# Patient Record
Sex: Male | Born: 1945 | Race: White | Hispanic: No | Marital: Married
Health system: Midwestern US, Community
[De-identification: ages and names within clinical notes are randomized; demographics above are authoritative.]

## PROBLEM LIST (undated history)

## (undated) DIAGNOSIS — IMO0001 Reserved for inherently not codable concepts without codable children: Secondary | ICD-10-CM

## (undated) DIAGNOSIS — T8149XA Infection following a procedure, other surgical site, initial encounter: Secondary | ICD-10-CM

## (undated) DIAGNOSIS — R519 Headache, unspecified: Secondary | ICD-10-CM

## (undated) DIAGNOSIS — A498 Other bacterial infections of unspecified site: Secondary | ICD-10-CM

## (undated) DIAGNOSIS — R918 Other nonspecific abnormal finding of lung field: Secondary | ICD-10-CM

---

## 2017-01-07 ENCOUNTER — Emergency Department: Admit: 2017-01-07 | Payer: MEDICARE | Primary: Family

## 2017-01-07 ENCOUNTER — Inpatient Hospital Stay: Admit: 2017-01-07 | Discharge: 2017-01-07 | Disposition: A | Discharge status: Home / Self Care | Payer: MEDICARE

## 2017-01-07 DIAGNOSIS — M25461 Effusion, right knee: Secondary | ICD-10-CM

## 2017-01-07 MED ORDER — OXYCODONE-ACETAMINOPHEN 5-325 MG PO TABS
5-325 MG | Freq: Once | ORAL | Status: AC
Start: 2017-01-07 — End: 2017-01-07
  Administered 2017-01-07: 16:00:00 1 via ORAL

## 2017-01-07 MED ORDER — OXYCODONE-ACETAMINOPHEN 5-325 MG PO TABS
5-325 MG | ORAL_TABLET | Freq: Three times a day (TID) | ORAL | 0 refills | Status: AC | PRN
Start: 2017-01-07 — End: 2017-01-13

## 2017-01-07 MED ORDER — OXYCODONE-ACETAMINOPHEN 5-325 MG PO TABS
5-325 MG | ORAL | Status: AC
Start: 2017-01-07 — End: 2017-01-08

## 2017-01-07 NOTE — Discharge Instructions (Signed)
Take medications as directed.  Do not take this medication if you have to drive, work or operate machinery.  Follow up with the orthopedist listed above or the orthopedist of your choice.

## 2017-01-07 NOTE — ED Provider Notes (Signed)
[address] [facility] ED  eMERGENCY dEPARTMENT eNCOUnter      Pt Name: Nicholas Santiago  MRN: [id number]  Birthdate [birth year]  Date of evaluation: 01/07/2017  Provider: Pecola Lawless, APRN - CNP    CHIEF COMPLAINT       Chief Complaint   Patient presents with   . Knee Pain     right seen at [facility] yesterday         HISTORY OF PRESENT ILLNESS  (Location/Symptom, Timing/Onset, Context/Setting, Quality, Duration, Modifying Factors, Severity.)   Nicholas Santiago is a 71 y.o. male who presents to the emergency department private auto with the knee pain.  He has had chronic pain since a military injury in 1971.  Friday while at work he was walking up steps and fell backwards causing him to twist his knee.  He did not fall to the ground.  He has had pain since that time.  His knee feels unsteady when he ambulates.  He rested his knee throughout the weekend but went back to work Monday and the pain worsened to the point were he is now using a crutch.  He is having difficulty bearing weight.  He is diffuse right knee pain that he rates at an 8 when weightbearing.  He gets significant relief at rest.  Pain does not radiate.  No erythema, warmth, swelling or fevers.  He did get seen at the [facility] clinic in [facility] yesterday was told he is "too old for surgery".     Nursing Notes were reviewed.    ALLERGIES     Pcn [penicillins]    CURRENT MEDICATIONS       Previous Medications    ATENOLOL PO    Take by mouth daily    FUROSEMIDE (LASIX PO)    Take by mouth daily    GABAPENTIN (NEURONTIN PO)    Take by mouth 3 times daily    POTASSIUM PO    Take by mouth 2 times daily    TRAZODONE HCL PO    Take by mouth nightly       PAST MEDICAL HISTORY         Diagnosis Date   . Cerebral artery occlusion with cerebral infarction (HCC)    . COPD (chronic obstructive pulmonary disease) (HCC)    . Hyperlipidemia    . Hypertension    . PTSD (post-traumatic stress disorder)        SURGICAL HISTORY           Procedure Laterality Date   . CORONARY ARTERY  BYPASS GRAFT     . KNEE SURGERY Right          FAMILY HISTORY     History reviewed. No pertinent family history.  No family status information on file.        SOCIAL HISTORY      reports that he has been smoking Cigarettes.  He has been smoking about 1.00 pack per day. He has never used smokeless tobacco. He reports that he drinks alcohol. He reports that he does not use drugs.    REVIEW OF SYSTEMS    (2-9 systems for level 4, 10 or more for level 5)     Review of Systems   All other systems reviewed and are negative.       Except as noted above the remainder of the review of systems was reviewed and negative.     PHYSICAL EXAM    (up to 7 for level  4, 8 or more for level 5)     ED Triage Vitals [01/07/17 1138]   BP Temp Temp Source Pulse Resp SpO2 Height Weight   (!) 143/51 97.5 F (36.4 C) Oral 71 18 97 %  (1.778 m) 270 lb 6.4 oz (122.7 kg)       Physical Exam   Constitutional: He is oriented to person, place, and time. He appears well-developed and well-nourished.   Eyes: Conjunctivae are normal. Right eye exhibits no discharge. Left eye exhibits no discharge.   Cardiovascular: Normal rate, regular rhythm and intact distal pulses.    Pulmonary/Chest: Effort normal and breath sounds normal. No respiratory distress.   Musculoskeletal:   Able to fully flex and extend the right hip and knee.  Full range of motion of the ankle.  Mild diffuse bony tenderness to the right knee. Joint is not hot to touch   Neurological: He is alert and oriented to person, place, and time.   Skin: Skin is warm and dry. No erythema.         DIAGNOSTIC RESULTS     EKG: All EKG's are interpreted by the Emergency Department Physician who either signs or Co-signs this chart in the absence of a cardiologist.    RADIOLOGY:   Non-plain film images such as CT, Ultrasound and MRI are read by the radiologist. 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             LABS:  Labs Reviewed - No data to display    All other labs were within normal range or not returned as of this dictation.    EMERGENCY DEPARTMENT COURSE and DIFFERENTIAL DIAGNOSIS/MDM:   Vitals:    Vitals:    01/07/17 1138   BP: (!) 143/51   Pulse: 71   Resp: 18   Temp: 97.5 F (36.4 C)   TempSrc: Oral   SpO2: 97%   Weight: 270 lb 6.4 oz (122.7 kg)   Height:  (1.778 m)       Medical Decision Making: 71 year old male with exacerbation of chronic knee pain.  Imaging shows degenerative changes and small effusion.  He was placed in a knee immobilizer.  I did check the placement of the immobilizer and found it to be appropriate.  He remains neurovascularly intact.  He was discharged with pain medication and instructions to follow-up with the orthopedist.        FINAL IMPRESSION      1. Acute pain of right knee    2. Knee effusion, right          DISPOSITION/PLAN   DISPOSITION Decision To Discharge 01/07/2017 12:41:28 PM      PATIENT REFERRED TO:   Liston Alba, DO            DISCHARGE MEDICATIONS:     New Prescriptions    OXYCODONE-ACETAMINOPHEN (PERCOCET) 5-325 MG PER TABLET    Take 1 tablet by mouth every 8 hours as needed for Pain for up to 6 days..       (Please note that portions of this note were completed with a voice recognition program.  Efforts were made to edit  the dictations but occasionally words are mis-transcribed.)    Ido Wollman A Mylani Gentry, APRN - CNP             Julaine FusiEdna A Jodi Kappes, APRN - CNP  01/07/17 1248

## 2017-01-07 NOTE — ED Notes (Signed)
Pt presents to ED ambulatory with steady gait c/o of chronic right knee pain since 1971. Pt states 5 days ago twisting it and the pain increasing. Pt rates pain 10/10. No deformity or bruising noted. Swelling noted to right knee. Pt has limited ROM due to pain. Skin is pink, warm, and dry.      Korea Severs Tenna Delaine, RN  01/07/17 [phone]

## 2018-12-24 ENCOUNTER — Ambulatory Visit: Admit: 2018-12-24 | Discharge: 2018-12-24 | Attending: Family Medicine | Primary: Family Medicine

## 2018-12-24 DIAGNOSIS — R0789 Other chest pain: Secondary | ICD-10-CM

## 2018-12-24 NOTE — Progress Notes (Signed)
Subjective:      Patient ID: Nicholas Santiago is a 73 y.o. male.    Hearts good thinks it was his PTSD      Review of Systems   Constitutional: Negative.        Objective:   Physical Exam  Vitals signs reviewed.   HENT:      Head: Normocephalic.   Cardiovascular:      Rate and Rhythm: Normal rate.   Pulmonary:      Effort: Pulmonary effort is normal.   Neurological:      Mental Status: He is alert.         Assessment:      1. Atypical chest pain    2. PTSD (post-traumatic stress disorder)            Plan:      Si was seen today for follow-up from hospital and headache.    Diagnoses and all orders for this visit:    Atypical chest pain    PTSD (post-traumatic stress disorder)                Electronically signed by Ulice Dash Won Kreuzer, MD on 12/24/2018 at 11:24 AM

## 2019-07-20 ENCOUNTER — Ambulatory Visit: Admit: 2019-07-20 | Discharge: 2019-07-20 | Payer: MEDICARE | Attending: Family Medicine | Primary: Family Medicine

## 2019-07-20 DIAGNOSIS — Z01818 Encounter for other preprocedural examination: Secondary | ICD-10-CM

## 2019-07-20 MED ORDER — CLONIDINE HCL 0.1 MG PO TABS
0.1 MG | ORAL_TABLET | Freq: Two times a day (BID) | ORAL | 3 refills | Status: DC
Start: 2019-07-20 — End: 2019-10-19

## 2019-07-20 MED ORDER — GABAPENTIN 300 MG PO CAPS
300 MG | ORAL_CAPSULE | Freq: Two times a day (BID) | ORAL | 3 refills | Status: DC
Start: 2019-07-20 — End: 2020-01-04

## 2019-07-20 NOTE — Progress Notes (Signed)
Subjective:      Patient ID: Nicholas Santiago is a 74 y.o. male.    Doing well  Still smokes and wants to quit  Dr Nunzio Cory to do TKR in future  Concerned re leg swelling and BP and fatigue that [facility] been [facility] with  Here for preop clearance      Review of Systems   Constitutional: Positive for activity change and fatigue.   HENT: Negative.    Eyes: Negative.    Respiratory: Positive for shortness of breath.    Cardiovascular: Positive for leg swelling.   Gastrointestinal: Positive for abdominal distention.   Endocrine: Negative.    Genitourinary: Negative.    Musculoskeletal: Positive for arthralgias and joint swelling.   Neurological: Negative.    Hematological: Negative.    Psychiatric/Behavioral: Negative.        Objective:   Physical Exam  Vitals signs reviewed.   Constitutional:       Appearance: He is obese.   HENT:      Head: Normocephalic and atraumatic.      Right Ear: Tympanic membrane normal.      Left Ear: Tympanic membrane normal.      Nose: Nose normal.   Eyes:      Pupils: Pupils are equal, round, and reactive to light.   Neck:      Musculoskeletal: Normal range of motion.   Cardiovascular:      Rate and Rhythm: Normal rate and regular rhythm.   Pulmonary:      Effort: Pulmonary effort is normal.      Breath sounds: Normal breath sounds.   Abdominal:      General: Bowel sounds are normal.   Musculoskeletal:         General: Swelling present.      Right lower leg: Edema present.   Skin:     General: Skin is warm.   Neurological:      General: No focal deficit present.      Mental Status: He is alert.   Psychiatric:         Mood and Affect: Mood normal.         Behavior: Behavior normal.         Thought Content: Thought content normal.         Judgment: Judgment normal.         Assessment:      No diagnosis found.        Plan:      There are no diagnoses linked to this encounter.  D/c atenolol  Stay on coreg  Trial of clonidine for occassional BP elevations  Says had a colonoscopy awile  back  Lots of care thru [place] with limited records          Electronically signed by Ulice Dash Coco Sharpnack, MD on 07/20/2019 at 12:09 PM

## 2019-08-30 ENCOUNTER — Ambulatory Visit: Admit: 2019-08-30 | Discharge: 2019-08-30 | Payer: MEDICARE | Attending: Family Medicine | Primary: Family Medicine

## 2019-08-30 DIAGNOSIS — J449 Chronic obstructive pulmonary disease, unspecified: Secondary | ICD-10-CM

## 2019-08-30 NOTE — Progress Notes (Signed)
Subjective:      Patient ID: Nicholas Santiago is a 74 y.o. male.    Multiple medical issues  But needs a new knee  [facility]      Review of Systems   Constitutional: Positive for activity change.   HENT: Positive for dental problem.    Respiratory: Positive for shortness of breath.    Cardiovascular: Positive for leg swelling.   Gastrointestinal: Negative.    Endocrine: Negative.    Genitourinary: Negative.    Musculoskeletal: Positive for gait problem.   Skin: Negative.    Neurological:        Foot pain   Psychiatric/Behavioral: Negative.        Objective:   Physical Exam  Vitals reviewed.   Constitutional:       Appearance: Normal appearance.   HENT:      Head: Normocephalic and atraumatic.      Right Ear: Ear canal normal.      Left Ear: Ear canal normal.      Nose: Nose normal.      Mouth/Throat:      Mouth: Mucous membranes are dry.   Eyes:      Pupils: Pupils are equal, round, and reactive to light.   Cardiovascular:      Rate and Rhythm: Normal rate and regular rhythm.   Pulmonary:      Effort: Pulmonary effort is normal.   Abdominal:      General: Bowel sounds are normal.   Musculoskeletal:         General: Deformity present.      Cervical back: Normal range of motion.   Neurological:      General: No focal deficit present.      Mental Status: He is alert.   Psychiatric:         Mood and Affect: Mood normal.         Thought Content: Thought content normal.         Assessment:      No diagnosis found.        Plan:      There are no diagnoses linked to this encounter.            Electronically signed by Ulice Dash Sagal Gayton, MD on 08/30/2019 at 10:03 AM

## 2019-09-15 NOTE — Telephone Encounter (Signed)
pt declined AWV at this time.

## 2019-09-28 NOTE — Telephone Encounter (Signed)
I have recommended that this patient have a colonoscopy but he declines at this time. I have discussed the risks and benefits of this examination with him. The patient verbalizes understanding.

## 2019-10-03 ENCOUNTER — Ambulatory Visit: Admit: 2019-10-03 | Discharge: 2019-10-03 | Payer: MEDICARE | Attending: Family Medicine | Primary: Family Medicine

## 2019-10-03 DIAGNOSIS — R519 Headache, unspecified: Secondary | ICD-10-CM

## 2019-10-03 MED ORDER — BUTALBITAL-APAP-CAFF-COD 50-325-40-30 MG PO CAPS
50-325-40-30 MG | ORAL_CAPSULE | ORAL | 0 refills | Status: AC | PRN
Start: 2019-10-03 — End: 2019-11-02

## 2019-10-03 NOTE — Telephone Encounter (Signed)
Reason for Disposition  ??? Unexplained headache that is present > 24 hours    Additional Information  ??? Negative: SEVERE headache and not relieved by pain meds     Has taken excedrin and tylenol    Answer Assessment - Initial Assessment Questions  1. LOCATION: "Where does it hurt?"     Above L ear area    2. ONSET: "When did the headache start?" (Minutes, hours or days)       X 3 wks    3. PATTERN: "Does the pain come and go, or has it been constant since it started?"      Comes and goes    4. SEVERITY: "How bad is the pain?" and "What does it keep you from doing?"  (e.g., Scale 1-10; mild, moderate, or severe)    - MILD (1-3): doesn't interfere with normal activities     - MODERATE (4-7): interferes with normal activities or awakens from sleep     - SEVERE (8-10): excruciating pain, unable to do any normal activities         5 per pt    5. RECURRENT SYMPTOM: "Have you ever had headaches before?" If so, ask: "When was the last time?" and "What happened that time?"       denies    6. CAUSE: "What do you think is causing the headache?"      Unsure- states there are scabs in area and is painful to touch, states pain began after starting abx    7. MIGRAINE: "Have you been diagnosed with migraine headaches?" If so, ask: "Is this headache similar?"   denies        8. HEAD INJURY: "Has there been any recent injury to the head?"       Denies    9. OTHER SYMPTOMS: "Do you have any other symptoms?" (fever, stiff neck, eye pain, sore throat, cold symptoms)     Dizziness/lightheadedness started this weekend    10. PREGNANCY: "Is there any chance you are pregnant?" "When was your last menstrual period?"       n/a    Protocols used: HEADACHE-ADULT-OH    Received call from Amy at [facility] with [employer] Complaint.    Brief description of triage: Pt called with concern of moderate intermittent L temporal headache X 3 wks. Notes scabbing over area and has some occasional dizziness/lightheadedness. Hx of recent p/o surgical  site infection/abx therapy.     Triage indicates for patient to PCP today or tomorrow.    Care advice provided, patient verbalizes understanding; denies any other questions or concerns; instructed to call back for any new or worsening symptoms.    Writer provided warm transfer to Maralyn Sago at [facility] for appointment scheduling.    Attention Provider:  Thank you for allowing me to participate in the care of your patient.  The patient was connected to triage in response to information provided to the [facility].  Please do not respond through this encounter as the response is not directed to a shared pool.

## 2019-10-03 NOTE — Progress Notes (Signed)
Subjective:      Patient ID: Nicholas Santiago is a 74 y.o. male.    Right sided headache since knee surgery  Says he can feel bumps   No fever  Has had shingles vax      Review of Systems   HENT: Negative.    Eyes: Negative.    Respiratory: Negative.    Gastrointestinal: Negative.    Genitourinary: Negative.    Musculoskeletal: Negative.    Neurological: Positive for headaches.   Psychiatric/Behavioral: Negative.    All other systems reviewed and are negative.      Objective:   Physical Exam  HENT:      Head: Normocephalic and atraumatic.      Right Ear: Tympanic membrane normal.      Left Ear: Tympanic membrane normal.      Nose: Nose normal.   Eyes:      Pupils: Pupils are equal, round, and reactive to light.   Cardiovascular:      Rate and Rhythm: Normal rate.   Pulmonary:      Effort: Pulmonary effort is normal.   Musculoskeletal:         General: Normal range of motion.   Skin:     General: Skin is warm.   Neurological:      General: No focal deficit present.      Mental Status: He is alert.   Psychiatric:         Thought Content: Thought content normal.         Assessment:      1. Acute nonintractable headache, unspecified headache type            Plan:      [name] was seen today for headache.    Diagnoses and all orders for this visit:    Acute nonintractable headache, unspecified headache type  -     butalbital-acetaminophen-caffeine-codeine (FIORICET WITH CODEINE) 50-325-40-30 MG per capsule; Take 1 capsule by mouth every 4 hours as needed for Headaches for up to 10 doses.                Electronically signed by Ulice Dash Finn Amos, MD on 10/03/2019 at 12:00 PM

## 2019-10-19 ENCOUNTER — Ambulatory Visit: Admit: 2019-10-19 | Discharge: 2019-10-19 | Payer: MEDICARE | Attending: Family Medicine | Primary: Family Medicine

## 2019-10-19 DIAGNOSIS — R519 Headache, unspecified: Secondary | ICD-10-CM

## 2019-10-19 MED ORDER — PREDNISONE 20 MG PO TABS
20 MG | ORAL_TABLET | Freq: Two times a day (BID) | ORAL | 0 refills | Status: AC
Start: 2019-10-19 — End: 2019-10-24

## 2019-10-21 NOTE — Progress Notes (Signed)
Subjective:      Patient ID: Nicholas Santiago is a 74 y.o. male.    fiorecet did nothing   Pain is up and down right face    Headache         Review of Systems   Constitutional: Negative.    Neurological: Positive for headaches.   All other systems reviewed and are negative.      Objective:   Physical Exam  Constitutional:       Appearance: Normal appearance.   Neurological:      Mental Status: He is alert.      Comments: No lesions  Slight assymetry of right eye region         Assessment:      1. Right temporal headache            Plan:      Nicholas Santiago was seen today for headache.    Diagnoses and all orders for this visit:    Right temporal headache    Other orders  -     predniSONE (DELTASONE) 20 MG tablet; Take 1 tablet by mouth 2 times daily for 5 days    does not look like shingles or Bells  Could this be temporal arteritis?            Electronically signed by Ulice Dash Parry Po, MD on 10/19/2019 at 1:16 PM

## 2019-10-31 ENCOUNTER — Encounter

## 2019-10-31 NOTE — Telephone Encounter (Signed)
-----   Message from Blake Divine sent at 10/31/2019  8:34 AM EDT -----  Subject: Referral Request    QUESTIONS   Reason for referral request? patient needs a referral to a neurologist.   having pains in the right side of his head behind his ear. Moving behind   his eye, and slowly moving to the left side. fax number [phone]   Has the physician seen you for this condition before? No   Preferred Specialist (if applicable)?   Do you already have an appointment scheduled? No  Additional Information for Provider?   ---------------------------------------------------------------------------  --------------  CALL BACK INFO  What is the best way for the office to contact you? OK to leave message on   voicemail  Preferred Call Back Phone Number? [phone]

## 2019-11-01 ENCOUNTER — Ambulatory Visit: Admit: 2019-11-01 | Discharge: 2019-11-01 | Payer: MEDICARE | Attending: Family Medicine | Primary: Family Medicine

## 2019-11-01 ENCOUNTER — Telehealth

## 2019-11-01 DIAGNOSIS — R519 Headache, unspecified: Secondary | ICD-10-CM

## 2019-11-01 NOTE — Telephone Encounter (Signed)
[facility] called and stated they need pre-cert for the imaging ordered ,faxed over last 2 office notes as that is all that could be found

## 2019-11-01 NOTE — Progress Notes (Signed)
Subjective:      Patient ID: Nicholas Santiago is a 74 y.o. male.    1 month right side of head down to neck with some visual disturbance some relief with meds but not lasting  No resp or sinus issues  Is really knocking him down    Other  Associated symptoms include headaches.       Review of Systems   Constitutional: Negative.    HENT: Negative.    Eyes: Positive for visual disturbance.   Respiratory: Negative.    Cardiovascular: Negative.    Gastrointestinal: Negative.    Musculoskeletal: Negative.    Neurological: Positive for headaches.   Psychiatric/Behavioral: Negative.    All other systems reviewed and are negative.      Objective:   Physical Exam  HENT:      Head: Normocephalic and atraumatic.      Nose: Nose normal.   Eyes:      Pupils: Pupils are equal, round, and reactive to light.   Musculoskeletal:         General: Normal range of motion.      Cervical back: Rigidity present.   Neurological:      Mental Status: He is alert.      Comments: CN 2 - 12 intact   Psychiatric:         Mood and Affect: Mood normal.         Thought Content: Thought content normal.         Judgment: Judgment normal.         Assessment:      1. Acute intractable headache, unspecified headache type    2. Right temporal headache            Plan:      Genie was seen today for other.    Diagnoses and all orders for this visit:    Acute intractable headache, unspecified headache type  -     CT CERVICAL SPINE WO CONTRAST; Future  -     CT HEAD WO CONTRAST; Future    Right temporal headache                Electronically signed by Ulice Dash Nyx Keady, MD on 11/01/2019 at 10:29 AM

## 2019-11-03 NOTE — Telephone Encounter (Signed)
-----   Message from Darden Amber sent at 11/03/2019  9:25 AM EDT -----  Subject: Message to Provider    QUESTIONS  Information for Provider? Pt needs provider to fax over both CT scans to   the [place] in [employer]. The fax number is [phone]. Please call pt once   this has been faxed over.   ---------------------------------------------------------------------------  --------------  Cleotis Lema INFO  What is the best way for the office to contact you? OK to leave message on   voicemail  Preferred Call Back Phone Number? [phone]  ---------------------------------------------------------------------------  --------------  SCRIPT ANSWERS  Relationship to Patient? Other  Representative Name? Servando Kyllonen   Is the Representative on the appropriate HIPAA document in Epic? Yes

## 2019-11-04 NOTE — Telephone Encounter (Signed)
Contacted pre cert department due to them needing a peer review. The physician I spoke to said no to the spine CT but said yes to the brain CT. He suggested to do the brain for any abnormalities before moving to the spine.

## 2019-11-07 NOTE — Addendum Note (Signed)
Addended by: Lynnette Caffey on: 11/07/2019 08:12 AM     Modules accepted: Orders

## 2019-11-08 NOTE — Telephone Encounter (Signed)
error 

## 2019-11-10 NOTE — Telephone Encounter (Signed)
-----   Message from Dallas Schimke sent at 11/10/2019  1:12 PM EDT -----  Subject: Message to Provider    QUESTIONS  Information for Provider? Patient would like to a call to discuss results   of CT Scan when they arrive.   ---------------------------------------------------------------------------  --------------  CALL BACK INFO  What is the best way for the office to contact you? OK to leave message on   voicemail  Preferred Call Back Phone Number? [phone]  ---------------------------------------------------------------------------  --------------  SCRIPT ANSWERS  Relationship to Patient? Self

## 2019-11-10 NOTE — Telephone Encounter (Signed)
-----   Message from Deborah Bishop sent at 11/10/2019  1:12 PM EDT -----  Subject: Message to Provider    QUESTIONS  Information for Provider? Patient would like to a call to discuss results   of CT Scan when they arrive.   ---------------------------------------------------------------------------  --------------  CALL BACK INFO  What is the best way for the office to contact you? OK to leave message on   voicemail  Preferred Call Back Phone Number? [phone]  ---------------------------------------------------------------------------  --------------  SCRIPT ANSWERS  Relationship to Patient? Self

## 2019-11-10 NOTE — Telephone Encounter (Signed)
-----   Message from Jeri Cos sent at 11/10/2019  3:36 PM EDT -----  Subject: Results Request    QUESTIONS  Which lab or imaging result is the patient calling about? CT Scan  Which provider ordered the test? Suzi Roots   At what location was the test performed?   Date the test was performed? 2019-11-08  Additional Information for Provider? Patient is still suffering from his   headaches   ---------------------------------------------------------------------------  --------------  CALL BACK INFO  What is the best way for the office to contact you? OK to leave message on   voicemail  Preferred Call Back Phone Number? [phone]

## 2019-11-11 NOTE — Telephone Encounter (Signed)
Called [facility] to get the results for the ct scan that Adrean had done, they said they will fax over the results. Dr. Lucretia Field MA will call the pt with the results when the doctor signs off on it.

## 2019-11-11 NOTE — Telephone Encounter (Signed)
-----   Message from Drue Novel sent at 11/11/2019 10:33 AM EDT -----  Subject: Results Request    QUESTIONS  Which lab or imaging result is the patient calling about? CT scan  Which provider ordered the test? Suzi Roots   At what location was the test performed?   Date the test was performed? 2019-11-08  Additional Information for Provider? test done at [facility] ,   please call pt with results   ---------------------------------------------------------------------------  --------------  CALL BACK INFO  What is the best way for the office to contact you? OK to leave message on   voicemail  Preferred Call Back Phone Number? [phone]

## 2019-11-11 NOTE — Telephone Encounter (Signed)
Results came in after Dood had left for the day. Will call Monday after Dood reviews them. He expressed he is having so much pain it is making him physically ill. I instructed him to go to the ER if he is in that much pain.

## 2019-11-14 ENCOUNTER — Ambulatory Visit: Admit: 2019-11-14 | Discharge: 2019-11-14 | Payer: MEDICARE | Attending: Family Medicine | Primary: Family Medicine

## 2019-11-14 DIAGNOSIS — R300 Dysuria: Secondary | ICD-10-CM

## 2019-11-14 LAB — POCT URINALYSIS DIPSTICK
Bilirubin, UA: NEGATIVE
Blood, UA POC: NEGATIVE
Glucose, UA POC: NEGATIVE
Ketones, UA: NEGATIVE
Leukocytes, UA: NEGATIVE
Nitrite, UA: NEGATIVE
Protein, UA POC: NEGATIVE
Spec Grav, UA: 1.03
Urobilinogen, UA: 0.2
pH, UA: 6

## 2019-11-14 MED ORDER — CEPHALEXIN 500 MG PO CAPS
500 MG | ORAL_CAPSULE | Freq: Three times a day (TID) | ORAL | 0 refills | Status: AC
Start: 2019-11-14 — End: 2019-11-21

## 2019-11-14 NOTE — Progress Notes (Signed)
Subjective:     Patient ID: Nicholas Santiago is a 74 y.o. male    HPI  This patient presents for a same-day appointment walk-in.  He is a patient of Dr. Adine Madura who presents with burning painful urination and the passage of whitish mucoid material in his urine.  He denies any flank pain or history of kidney stones.  He denies any history of sexually transmitted diseases.  Secondly the patient complains of right temporal headache and pain in the face.  He asked for the report of his CT scan of the brain which was grossly normal.  I was instructed by Dr. Malachi Carl not to address this concern as he was handling it and had referred him to a neurologist.      Review of Systems  Denies chest pain palpitations shortness of breath abdominal pain blood in the urine diarrhea or constipation        Objective:     Physical Exam  Vitals and nursing note reviewed.   Constitutional:       General: He is not in acute distress.     Appearance: Normal appearance.   HENT:      Head: Normocephalic.      Right Ear: External ear normal.      Left Ear: External ear normal.      Nose: Nose normal.      Mouth/Throat:      Mouth: Mucous membranes are moist.   Eyes:      Conjunctiva/sclera: Conjunctivae normal.   Cardiovascular:      Rate and Rhythm: Normal rate and regular rhythm.      Heart sounds: Normal heart sounds.   Pulmonary:      Effort: Pulmonary effort is normal.      Breath sounds: Normal breath sounds.   Abdominal:      Tenderness: There is abdominal tenderness. There is no right CVA tenderness, left CVA tenderness, guarding or rebound.      Comments: Does have some minimal tenderness in the right mid abdomen but there is no rebound guarding or mass detected   Musculoskeletal:      Cervical back: Normal range of motion.      Right lower leg: No edema.      Left lower leg: No edema.   Skin:     General: Skin is warm and dry.   Neurological:      General: No focal deficit present.      Mental Status: He is alert and oriented to  person, place, and time.   Psychiatric:         Mood and Affect: Mood normal.         Behavior: Behavior normal.           Assessment/Plan:     1. Dysuria    2. Right lower quadrant abdominal pain            [name] was seen today for dysuria.    Diagnoses and all orders for this visit:    Dysuria  -     POCT Urinalysis no Micro  We will treat with antibiotics empirically because of his history  Right lower quadrant abdominal pain  If pain gets any worse please contact the office or had to the emergency room   other orders  -     cephALEXin (KEFLEX) 500 MG capsule; Take 1 capsule by mouth 3 times daily for 7 days      Start Cephalexin 500 mg tid  1 week    Avoid caffeine  Alcohol    Please follow-up with Dr. Lucretia Field in regard to his recommendations for the headache.  I have not addressed these    Call Dr Malachi Carl if no better        Marlou Starks, MD    Please note that this chart was generated using voice recognition Dragon dictation software.  Although every effort was made to ensure the accuracy of this automated transcription, some errors in transcription may have occurred.

## 2019-11-14 NOTE — Telephone Encounter (Signed)
[facility] called in regards to a call back from clinical staff [phone]

## 2019-11-15 ENCOUNTER — Encounter

## 2019-12-07 ENCOUNTER — Ambulatory Visit: Admit: 2019-12-07 | Discharge: 2019-12-07 | Payer: MEDICARE | Attending: Family | Primary: Family Medicine

## 2019-12-07 DIAGNOSIS — G44201 Tension-type headache, unspecified, intractable: Secondary | ICD-10-CM

## 2019-12-07 MED ORDER — BUTALBITAL-APAP-CAFF-COD 50-325-40-30 MG PO CAPS
50-325-40-30 MG | ORAL_CAPSULE | Freq: Four times a day (QID) | ORAL | 0 refills | Status: AC | PRN
Start: 2019-12-07 — End: 2019-12-17

## 2019-12-07 NOTE — Progress Notes (Signed)
[facility]  [facility] [place] [facility]  [address]  [place] [address]  Dept: [phone]       Nicholas Santiago is a 74 y.o. male who presents to the office today for evaluation of Headache and Dizziness    Pt here with concerns of headache. He had a CT scan which was normal, has an appt with neuro on 01/19/20. He sees cardiology. His BP has been running low and is low today, dicussed decreasing carvedilol to once daily instead of BID. Dr. Malachi Carl also came in and assessed patient. Fioricet helped in the past, will give script, reduce carvedilol, and keep f/u appt with neuro. If symptoms not improving or worsening, call office. Pt understands and agreeable to plan.     Patient Active Problem List    Diagnosis Date Noted   ??? Essential hypertension 07/20/2019     Patient's BMI is Body mass index is 35.1 kg/m??.   Social History     Tobacco Use   ??? Smoking status: Current Every Day Smoker     Packs/day: 1.00     Years: 51.00     Pack years: 51.00     Types: Cigarettes     Start date: 3   ??? Smokeless tobacco: Never Used   Vaping Use   ??? Vaping Use: Never used   Substance Use Topics   ??? Alcohol use: Yes     Comment: occasionally   ??? Drug use: No      Past Medical History:   Diagnosis Date   ??? 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 COPD (chronic obstructive pulmonary disease) (HCC)    ??? Headache    ??? Hyperlipidemia    ??? Hypertension    ??? PTSD (post-traumatic stress disorder)       No data recorded   Immunization:  Immunization History   Administered Date(s) Administered   ??? Influenza, High-dose, Quadv, 65 yrs +, IM (Fluzone) 03/21/2019     Past Surgical History:   Procedure Laterality Date   ??? CORONARY ARTERY BYPASS GRAFT     ??? JOINT REPLACEMENT     ??? KNEE SURGERY Right    History reviewed. No pertinent family history.  Current Outpatient Medications   Medication Sig Dispense Refill   ??? butalbital-acetaminophen-caffeine-codeine (FIORICET WITH CODEINE) 50-325-40-30 MG  per capsule Take 1 capsule by mouth every 6 hours as needed for Headaches for up to 10 days. 20 capsule 0   ??? atorvastatin (LIPITOR) 80 MG tablet Take 80 mg by mouth     ??? gabapentin (NEURONTIN) 300 MG capsule Take 1 capsule by mouth 2 times daily for 30 days. 60 capsule 3   ??? aspirin 81 MG EC tablet Take 81 mg by mouth daily     ??? carvedilol (COREG) 12.5 MG tablet Take 12.5 mg by mouth 2 times daily     ??? potassium chloride (MICRO-K) 10 MEQ extended release capsule Take by mouth 2 times daily     ??? furosemide (LASIX) 80 MG tablet Take 80 mg by mouth daily       No current facility-administered medications for this visit.     The patient's past medical, surgical, social, and family history as well as his current medications and allergies were reviewed as documented in today's encounter.    Review of Systems   Constitutional: Negative.    HENT: Negative.    Eyes: Negative.    Respiratory: Negative.    Cardiovascular:  Negative.    Gastrointestinal: Negative.    Endocrine: Negative.    Genitourinary: Negative.    Musculoskeletal: Negative.    Skin: Negative.    Allergic/Immunologic: Negative.    Neurological: Positive for dizziness, light-headedness and headaches.   Hematological: Negative.    Psychiatric/Behavioral: Negative.    All other systems reviewed and are negative.     BP (!) 102/58    Pulse 78    Ht 5\' 10"  (1.778 m)    Wt 244 lb 9.6 oz (110.9 kg)    SpO2 98%    BMI 35.10 kg/m??   Physical Exam  Vitals and nursing note reviewed.   Constitutional:       Appearance: Normal appearance. He is normal weight.   HENT:      Right Ear: Tympanic membrane, ear canal and external ear normal.      Left Ear: Tympanic membrane, ear canal and external ear normal.      Nose: Nose normal.      Mouth/Throat:      Mouth: Mucous membranes are moist.      Pharynx: Oropharynx is clear.   Eyes:      Extraocular Movements: Extraocular movements intact.      Conjunctiva/sclera: Conjunctivae normal.      Pupils: Pupils are equal, round,  and reactive to light.   Cardiovascular:      Rate and Rhythm: Normal rate and regular rhythm.      Pulses: Normal pulses.      Heart sounds: Normal heart sounds.   Pulmonary:      Effort: Pulmonary effort is normal.      Breath sounds: Normal breath sounds.   Abdominal:      General: Abdomen is flat. Bowel sounds are normal.      Palpations: Abdomen is soft.   Musculoskeletal:      Cervical back: Normal range of motion.   Skin:     General: Skin is warm and dry.      Capillary Refill: Capillary refill takes less than 2 seconds.   Neurological:      General: No focal deficit present.      Mental Status: He is alert.       No results found for: WBC, HGB, HCT, MCV, PLT  No results found for: NA, K, CL, CO2, BUN, CREATININE, GLUCOSE, CALCIUM   No results found for: ALT, AST, GGT, ALKPHOS, BILITOT  No results found for: TSHFT4, TSH  No results found for: CHOL  No results found for: TRIG  No results found for: HDL  No results found for: LDLCALC, LDLCHOLESTEROL  No results found for: CHOLHDLRATIO  No results found for: LABA1C  No results found for: VITAMINB12  No results found for: FOLATE  No results found for: IRON, TIBC, FERRITIN  No results found for: VITD25  I personally reviewed testing with patient.  Otherwise labs within normal limits  ASSESSMENT AND PLAN  1. Acute intractable tension-type headache    - butalbital-acetaminophen-caffeine-codeine (FIORICET WITH CODEINE) 50-325-40-30 MG per capsule; Take 1 capsule by mouth every 6 hours as needed for Headaches for up to 10 days.  Dispense: 20 capsule; Refill: 0    2. Hypotension due to drugs       No orders of the defined types were placed in this encounter.    Orders Placed This Encounter   Medications   ??? butalbital-acetaminophen-caffeine-codeine (FIORICET WITH CODEINE) 50-325-40-30 MG per capsule     Sig: Take 1 capsule by mouth every 6  hours as needed for Headaches for up to 10 days.     Dispense:  20 capsule     Refill:  0     Reduce doses taken as pain becomes  manageable      Data Unavailable  Health Maintenance Due   Topic Date Due   ??? Potassium monitoring  Never done   ??? Creatinine monitoring  Never done   ??? AAA screen  Never done   ??? Hepatitis C screen  Never done   ??? Lipid screen  Never done   ??? COVID-19 Vaccine (1) Never done   ??? DTaP/Tdap/Td vaccine (1 - Tdap) Never done   ??? Colon cancer screen colonoscopy  Never done   ??? Shingles Vaccine (1 of 2) Never done   ??? Pneumococcal 65+ years Vaccine (1 of 1 - PPSV23) Never done   ??? Annual Wellness Visit (AWV)  Never done   ??? Flu vaccine (1) 12/07/2019      There are no discontinued medications.  The patient's past medical, surgical, social, and family history as well as his   current medications and allergies were reviewed as documented in today's encounter.    Medications, labs, diagnostic studies, consultations and follow-up as documented in this encounter.  Return in about 4 weeks (around 01/04/2020).  Future Appointments   Date Time Provider Department Center   01/19/2020 10:40 AM Merry Lofty, MD Neuro Spec [id number]     This note was completed by using the assistance of a speech-recognition program. However, inadvertent computerized transcription errors may be present. Although every effort was made to ensure accuracy, no guarantees can be provided that every mistake has been identified and corrected by editing.  Electronically signed by [place] Crane, APRN - CNP on 12/07/2019 at 12:23 PM

## 2019-12-07 NOTE — Telephone Encounter (Signed)
Received call from [facility] at [facility] with [employer].    Brief description of triage:no triage seen 11/15/19 for same problem.       Care advice provided, patient verbalizes understanding; denies any other questions or concerns; instructed to call back for any new or worsening symptoms.    Writer provided warm transfer to [employer] at [facility] for appointment scheduling.    Attention Provider:  Thank you for allowing me to participate in the care of your patient.  The patient was connected to triage in response to information provided to the [facility].  Please do not respond through this encounter as the response is not directed to a shared pool.        Reason for Disposition  ??? Caller has already spoken with the PCP (or office), and has no further questions    Protocols used: NO CONTACT OR DUPLICATE CONTACT CALL-ADULT-[place]

## 2019-12-13 NOTE — Telephone Encounter (Signed)
Received call from [place] at [facility] with [employer] Complaint.    Brief description of triage: HA and dizziness    Triage indicates for patient to *no triage*. Patient has already seen PCP for this and denies any new or worsening symptoms. He states that he is just still having the same symptoms and wishes to have another appointment.         Reason for Disposition  ??? Caller has already spoken with the PCP (or office), and has no further questions    Protocols used: NO CONTACT OR DUPLICATE CONTACT CALL-ADULT-[place]

## 2019-12-14 ENCOUNTER — Inpatient Hospital Stay: Payer: MEDICARE | Primary: Family Medicine

## 2019-12-14 ENCOUNTER — Ambulatory Visit: Admit: 2019-12-14 | Discharge: 2019-12-14 | Payer: MEDICARE | Attending: Family Medicine | Primary: Family Medicine

## 2019-12-14 DIAGNOSIS — I10 Essential (primary) hypertension: Secondary | ICD-10-CM

## 2019-12-14 DIAGNOSIS — R739 Hyperglycemia, unspecified: Secondary | ICD-10-CM

## 2019-12-14 MED ORDER — POTASSIUM CHLORIDE ER 10 MEQ PO CPCR
10 MEQ | ORAL_CAPSULE | Freq: Every day | ORAL | 1 refills | Status: DC
Start: 2019-12-14 — End: 2020-01-04

## 2019-12-14 MED ORDER — CARVEDILOL 12.5 MG PO TABS
12.5 MG | ORAL_TABLET | Freq: Two times a day (BID) | ORAL | 1 refills | Status: DC
Start: 2019-12-14 — End: 2020-09-12

## 2019-12-14 MED ORDER — FUROSEMIDE 80 MG PO TABS
80 MG | ORAL_TABLET | Freq: Every day | ORAL | 1 refills | Status: DC
Start: 2019-12-14 — End: 2020-11-27

## 2019-12-15 LAB — HEPATIC FUNCTION PANEL
ALT: 19 U/L (ref 5–41)
AST: 17 U/L (ref <40)
Albumin/Globulin Ratio: 1.4 (ref 1.0–2.5)
Albumin: 4.1 g/dL (ref 3.5–5.2)
Alkaline Phosphatase: 132 U/L — ABNORMAL HIGH (ref 40–129)
Bilirubin, Direct: 0.1 mg/dL (ref <0.31)
Bilirubin, Indirect: 0.18 mg/dL (ref 0.00–1.00)
Total Bilirubin: 0.28 mg/dL — ABNORMAL LOW (ref 0.3–1.2)
Total Protein: 7 g/dL (ref 6.4–8.3)

## 2019-12-15 LAB — BUN & CREATININE
BUN: 12 mg/dL (ref 8–23)
Creatinine: 0.92 mg/dL (ref 0.70–1.20)
GFR African American: >60 mL/min (ref >60)
GFR Non-African American: >60 mL/min (ref >60)

## 2019-12-15 LAB — ELECTROLYTE PANEL
Anion Gap: 13 mmol/L (ref 9–17)
CO2: 25 mmol/L (ref 20–31)
Chloride: 103 mmol/L (ref 98–107)
Potassium: 4.1 mmol/L (ref 3.7–5.3)
Sodium: 141 mmol/L (ref 135–144)

## 2019-12-15 LAB — HEMOGLOBIN A1C
Estimated Avg Glucose: 143 mg/dL
Hemoglobin A1C: 6.6 % — ABNORMAL HIGH (ref 4.0–6.0)

## 2019-12-16 ENCOUNTER — Ambulatory Visit: Admit: 2019-12-16 | Discharge: 2019-12-16 | Payer: MEDICARE | Attending: Family Medicine | Primary: Family Medicine

## 2019-12-16 DIAGNOSIS — R739 Hyperglycemia, unspecified: Secondary | ICD-10-CM

## 2019-12-16 MED ORDER — METFORMIN HCL ER 500 MG PO TB24
500 MG | ORAL_TABLET | Freq: Every day | ORAL | 0 refills | Status: DC
Start: 2019-12-16 — End: 2020-01-04

## 2019-12-16 NOTE — Progress Notes (Signed)
Subjective:      Patient ID: Nicholas Santiago is a 74 y.o. male.    Asymptomatic with A1C of 6.6  Really likes carbs      Review of Systems   Constitutional: Negative.    HENT: Negative.    Eyes: Negative.    Respiratory: Negative.    Cardiovascular: Negative.    Gastrointestinal: Negative.    Endocrine: Negative.    Genitourinary: Negative.    Musculoskeletal: Negative.    Allergic/Immunologic: Negative.    Neurological: Negative.    Hematological: Negative.    Psychiatric/Behavioral: Negative.    All other systems reviewed and are negative.      Objective:   Physical Exam  Vitals reviewed.   Constitutional:       Appearance: Normal appearance.   Cardiovascular:      Rate and Rhythm: Normal rate.   Pulmonary:      Effort: Pulmonary effort is normal.   Skin:     General: Skin is warm.   Neurological:      Mental Status: He is alert.   Psychiatric:         Mood and Affect: Mood normal.         Thought Content: Thought content normal.         Assessment:      1. Hyperglycemia            Plan:      Nicholas Santiago was seen today for discuss labs and headache.    Diagnoses and all orders for this visit:    Hyperglycemia    Other orders  -     metFORMIN (GLUCOPHAGE-XR) 500 MG extended release tablet; Take 1 tablet by mouth daily (with breakfast)    discussed diet and exercise  He will consider medication  Waiting for neurology appointment and then will go to [place] for 6 or more months  Rt after he comes back for recheck  He also has a [place] [place]  Go to his cardiologist about the wide swings in BP          Electronically signed by Ulice Dash Helyne Genther, MD on 12/16/2019 at 11:10 AM

## 2019-12-16 NOTE — Progress Notes (Signed)
Subjective:      Patient ID: Nicholas Santiago is a 74 y.o. male.    Having wide swings in BP  Still has HA   Asks for labs      Review of Systems   Constitutional: Negative.    HENT: Negative.    Eyes: Negative.    Respiratory: Negative.    Cardiovascular: Negative.    Gastrointestinal: Negative.    Endocrine: Negative.    Genitourinary: Negative.    Musculoskeletal: Negative.    Neurological: Positive for headaches.   Hematological: Negative.    Psychiatric/Behavioral: Negative.    All other systems reviewed and are negative.      Objective:   Physical Exam  Vitals reviewed.   Constitutional:       Appearance: Normal appearance.   HENT:      Head: Normocephalic and atraumatic.      Nose: Nose normal.   Eyes:      Pupils: Pupils are equal, round, and reactive to light.   Cardiovascular:      Rate and Rhythm: Normal rate.   Pulmonary:      Effort: Pulmonary effort is normal.      Comments: coarse  Abdominal:      General: Bowel sounds are normal.   Musculoskeletal:         General: Normal range of motion.      Cervical back: Normal range of motion.   Skin:     General: Skin is warm.   Neurological:      Mental Status: He is alert.   Psychiatric:         Mood and Affect: Mood normal.         Thought Content: Thought content normal.         Assessment:      1. Hypertension, unspecified type    2. Hyperglycemia            Plan:      Savino was seen today for headache and other.    Diagnoses and all orders for this visit:    Hypertension, unspecified type  -     Hepatic Function Panel; Future  -     BUN & Creatinine; Future  -     Electrolyte Panel; Future  -     Cancel: Hemoglobin A1C; Future    Hyperglycemia  -     Hemoglobin A1C; Future    Other orders  -     carvedilol (COREG) 12.5 MG tablet; Take 0.5 tablets by mouth 2 times daily  -     furosemide (LASIX) 80 MG tablet; Take 0.5 tablets by mouth daily  -     potassium chloride (MICRO-K) 10 MEQ extended release capsule; Take 1 capsule by mouth daily    we  decreased his BB today and his lasix            Electronically signed by Ulice Dash Merrik Puebla, MD on 12/14/2019 at 11:26 AM

## 2020-01-04 ENCOUNTER — Ambulatory Visit: Admit: 2020-01-04 | Discharge: 2020-01-04 | Payer: MEDICARE | Attending: Family Medicine | Primary: Family Medicine

## 2020-01-04 DIAGNOSIS — R42 Dizziness and giddiness: Secondary | ICD-10-CM

## 2020-01-04 MED ORDER — MECLIZINE HCL 25 MG PO TABS
25 MG | ORAL_TABLET | Freq: Three times a day (TID) | ORAL | 0 refills | Status: AC | PRN
Start: 2020-01-04 — End: 2020-01-14

## 2020-01-04 MED ORDER — GABAPENTIN 300 MG PO CAPS
300 MG | ORAL_CAPSULE | Freq: Two times a day (BID) | ORAL | 3 refills | Status: DC
Start: 2020-01-04 — End: 2020-01-26

## 2020-01-04 MED ORDER — CLONIDINE HCL 0.1 MG PO TABS
0.1 MG | ORAL_TABLET | Freq: Two times a day (BID) | ORAL | 3 refills | Status: DC
Start: 2020-01-04 — End: 2020-01-26

## 2020-01-04 NOTE — Progress Notes (Signed)
Subjective:      Patient ID: Nicholas Santiago is a 74 y.o. male.    Weird symptoms since his covid vax      Review of Systems   Constitutional: Positive for activity change.   Psychiatric/Behavioral: Negative.        Objective:   Physical Exam  Vitals reviewed.   Constitutional:       Appearance: Normal appearance.   HENT:      Head: Normocephalic and atraumatic.      Mouth/Throat:      Mouth: Mucous membranes are moist.   Eyes:      Pupils: Pupils are equal, round, and reactive to light.   Cardiovascular:      Rate and Rhythm: Normal rate.   Musculoskeletal:         General: Normal range of motion.   Skin:     General: Skin is warm and dry.   Neurological:      Mental Status: He is alert.   Psychiatric:         Mood and Affect: Mood normal.         Thought Content: Thought content normal.         Assessment:      1. Dizzy spells            Plan:      [name] was seen today for other.    Diagnoses and all orders for this visit:    Dizzy spells    Other orders  -     gabapentin (NEURONTIN) 300 MG capsule; Take 1 capsule by mouth 2 times daily for 30 days.  -     cloNIDine (CATAPRES) 0.1 MG tablet; Take 1 tablet by mouth 2 times daily  -     meclizine (ANTIVERT) 25 MG tablet; Take 1 tablet by mouth 3 times daily as needed for Dizziness                Electronically signed by Nicholas Dash Deisy Ozbun, MD on 01/04/2020 at 3:03 PM

## 2020-01-19 ENCOUNTER — Ambulatory Visit: Admit: 2020-01-19 | Discharge: 2020-01-19 | Payer: MEDICARE | Attending: Neurology | Primary: Family Medicine

## 2020-01-19 DIAGNOSIS — R519 Headache, unspecified: Secondary | ICD-10-CM

## 2020-01-19 NOTE — Progress Notes (Signed)
[facility]  [address], [address]  [place], [place] [id number]  Ph: [phone] or [phone]  FAX: [phone]    Chief Complaint: headache     Dear Ulice DashSTEVEN BRADFORD DOOD, MD     I had the pleasure of seeing your patient today in neurology consultation for his symptoms. As you would recall Nicholas Santiago is a 74 y.o. male.  The patient is accompanied by his wife. Wife states patient complains of headaches and dizziness. Wife reports headaches began approximately 3 months ago. Patient reports headaches begin in the back of his neck and typically worsen upon ambulating. Headaches tend to radiate to the crown and frontal aspects of his head. Head is painful to touch, headaches worsen upon touch. Patient notes headaches can last up to 1 week. Wife states at times episodes occur a couple of times weekly. Patient reports headaches and dizziness occur simultaneously; usually onset of dizziness is gradual and occurs first and gives the sensation he is spinning. To alleviate symptoms, patient states he lays down in bed or sits. Patient denies passing out from headaches/dizziness. Patient admits to sound sensitivity. Admits to double vision and cloudy vision. Patient reports Meclizine 25 mg TID PRN is helping with dizziness.     Patient's mother had brain tumor.     Past Medical History:   Diagnosis Date   ??? Cerebral artery occlusion with cerebral infarction [facility](HCC)    ??? COPD (chronic obstructive pulmonary disease) (HCC)    ??? Headache    ??? Hyperlipidemia    ??? Hypertension    ??? PTSD (post-traumatic stress disorder)      Past Surgical History:   Procedure Laterality Date   ??? CORONARY ARTERY BYPASS GRAFT     ??? JOINT REPLACEMENT     ??? KNEE SURGERY Right      Allergies   Allergen Reactions   ??? Pcn [Penicillins] Swelling and Rash     No family history on file.   Social History     Socioeconomic History   ??? Marital status: Married     Spouse name: Not on file   ??? Number of children: Not on file    ??? Years of education: Not on file   ??? Highest education level: Not on file   Occupational History   ??? Not on file   Tobacco Use   ??? Smoking status: Current Every Day Smoker     Packs/day: 1.00     Years: 51.00     Pack years: 51.00     Types: Cigarettes     Start date: 721970   ??? Smokeless tobacco: Never Used   Vaping Use   ??? Vaping Use: Never used   Substance and Sexual Activity   ??? Alcohol use: Yes     Comment: occasionally   ??? Drug use: No   ??? Sexual activity: Not on file   Other Topics Concern   ??? Not on file   Social History Narrative   ??? Not on file     Social Determinants of Health     Financial Resource Strain: Low Risk    ??? Difficulty of Paying Living Expenses: Not hard at all   Food Insecurity: No Food Insecurity   ??? Worried About Programme researcher, broadcasting/film/videounning Out of Food in the Last Year: Never true   ??? Ran Out of Food in the Last Year: Never true   Transportation Needs:    ??? Lack of Transportation (Medical):    ??? Lack  of Transportation (Non-Medical):    Physical Activity:    ??? Days of Exercise per Week:    ??? Minutes of Exercise per Session:    Stress:    ??? Feeling of Stress :    Social Connections:    ??? Frequency of Communication with Friends and Family:    ??? Frequency of Social Gatherings with Friends and Family:    ??? Attends Religious Services:    ??? Database administrator or Organizations:    ??? Attends Engineer, structural:    ??? Marital Status:    Intimate Programme researcher, broadcasting/film/video Violence:    ??? Fear of Current or Ex-Partner:    ??? Emotionally Abused:    ??? Physically Abused:    ??? Sexually Abused:       There were no vitals taken for this visit.      HEENT []  Hearing Loss  []  Visual Disturbance  []  Tinnitus  []  Eye pain   Respiratory []  Shortness of Breath  []  Cough  []  Snoring   Cardiovascular []  Chest Pain  []  Palpitations  []  Lightheaded   GI []  Constipation  []  Diarrhea  []  Swallowing change  []  Nausea/vomiting   GU []  Urinary Frequency  []  Urinary Urgency   Musculoskeletal []  Neck pain  []  Back pain  []  Muscle pain  []  Restless  legs   Dermatologic []  Skin changes   Neurologic []  Memory loss/confusion  []  Seizures  []  Trouble walking or imbalance  [x]  Dizziness  []  Sleep disturbance  []  Weakness  []  Numbness  []  Tremors  []  Speech Difficulty  [x]  Headaches  []  Light Sensitivity  [x]  Sound Sensitivity   Endocrinology [] Excessive thirst  [] Excessive hunger   Psychiatric []  Anxiety/Depression  []  Hallucination   Allergy/immunology [] Hives/environmental allergies   Hematologic/lymph []  Abnormal bleeding  []  Abnormal bruising     General appearence: Normal. Well groomed. In no acute distress    Head: Normocephalic, atraumatic  Eyes: Extraocular movements intact, eye lids normal  Lungs: Respirations unlabored, chest wall no deformity  ENT: Normal external ear canals, no sinus tenderness  Heart: Regular rate rhythm  Abdomen: No masses, tenderness  Extremities: No cyanosis or edema, 2+ pulses  Musculoskeletal: Normal range of motion in all joints  Skin: Intact, normal skin color    Neurological examination:    Mental status   Alert and oriented; intact memory with no confusion, speech or language problems; no hallucinations or delusions     Cranial nerves   II - visual fields intact to confrontation                                                III, IV, VI - extra-ocular muscles full: no pupillary defect; no INO, no nystagmus, no ptosis   V - normal facial sensation                                                               VII - normal facial symmetry  VIII - intact hearing                                                                             IX, X - symmetrical palate                                                                  XI - symmetrical shoulder shrug                                                       XII - midline tongue without atrophy or fasciculation     Motor function  Normal muscle bulk and tone; normal power 5/5, including fine motor movements     Sensory  function Intact to touch, pin, vibration, proprioception     Cerebellar Intact fine motor movement. No involuntary movements or tremors     Reflex function Intact 2+ DTR and symmetric. Negative Babinski     Gait                  Normal station and gait       No results found for: LDLCALC, LDLCHOLESTEROL, LDLDIRECT  No components found for: CHLPL  No results found for: TRIG  No results found for: HDL  No results found for: LDLCALC  No results found for: LABVLDL  Lab Results   Component Value Date    LABA1C 6.6 (H) 12/14/2019     Lab Results   Component Value Date    EAG 143 12/14/2019     No results found for: QQIWLNLG92     Neurological work up:  CT head  CTA head and neck  MRI brain   2 D echo     All of patient's labs were personally reviewed. All the imaging studies were personally reviewed and discussed with the patient.     Assessment Recommendations:  Migraine headache with associated dizziness  Possible orthostatic hypotension    Patient admits to headache frequency. MRA Head W Contrast and MRI Brain WO Contrast ordered today to further investigation patient's symptoms. If testing is completed in [place], I informed patient to call me after to let me know how testing went.      Among other things, clonidine can worsen orthostatic hypotension. I will speak with PCP to inquire more information about tapering patient off of medication. I also discussed with patient using a Holter monitor to monitor if irregular heart beat in patient is cause of dizziness. Patient would not like to proceed with Holter monitor at this time.     I ordered a sedimentation rate test to detect inflammatory etiologies. Patient is to complete at his earliest convenience.     The patient is to follow up with me in 6-8 weeks or sooner if symptoms worsen.    STEVEN BRADFORD  DOOD, MD I would like to thank you for the consult. Please do not hesitate if you have any questions about the patient care.     Scribe Attestation:   By signing my  name below, I, RAMYA TALLA, attest that this documentation has been prepared under the direction and in the presence of Merry Lofty, MD.      Electronically Signed: Marcello Fennel. 01/19/2020 10:39 AM      Physician Attestation:   Carlyon Shadow MD, personally performed the services described in this documentation. All medical record entries made by the scribe were at my direction and in my presence. I have reviewed the chart and discharge instructions (if applicable) and agree that the record reflects my personal performance and is accurate and complete.    Electronically Signed: Merry Lofty 01/19/2020 10:39 AM    Diplomate, American Board of Psychiatry and Neurology  Diplomate, American Board of Clinical Neurophysiology  Diplomate, American Board of Epilepsy     This note is created with the assistance of a speech-recognition program. While intending to generate a document that actually reflects the content of the visit, the document can still have some errors including those of syntax and sound a- like substitutions which may escape proofreading.  In such instances, actual meaning can be extrapolated by contextual derivation.

## 2020-01-26 MED ORDER — CLONIDINE HCL 0.1 MG PO TABS
0.1 MG | ORAL_TABLET | Freq: Two times a day (BID) | ORAL | 0 refills | Status: DC
Start: 2020-01-26 — End: 2020-11-27

## 2020-01-26 MED ORDER — GABAPENTIN 300 MG PO CAPS
300 MG | ORAL_CAPSULE | Freq: Two times a day (BID) | ORAL | 0 refills | Status: DC
Start: 2020-01-26 — End: 2020-11-13

## 2020-01-31 ENCOUNTER — Inpatient Hospital Stay: Admit: 2020-01-31 | Payer: MEDICARE | Primary: Family Medicine

## 2020-01-31 ENCOUNTER — Encounter

## 2020-01-31 DIAGNOSIS — R519 Headache, unspecified: Secondary | ICD-10-CM

## 2020-02-07 NOTE — Telephone Encounter (Signed)
Nicholas Santiago called in asking for results of his most recent MRI/MRA. He wants to know if any further testing is needed as well because they are waiting to go to [place]. Thanks

## 2020-02-07 NOTE — Telephone Encounter (Signed)
MRI scan of the brain is unremarkable.  At this time, I do not believe any further work-up is warranted and patient can go to [place].  We can discuss further once he comes back.  Thank you

## 2020-02-07 NOTE — Telephone Encounter (Signed)
Patient notified.

## 2020-02-22 ENCOUNTER — Encounter: Attending: Neurology | Primary: Family Medicine

## 2020-07-16 NOTE — Telephone Encounter (Signed)
Received call from Elease Hashimoto regarding patient-- Previous patient of Dr.Dood    Patient is currently in [place]-- Patient travels to [place] in winter time.     Elease Hashimoto sates patient had bowel obstruction and was "very sick" while in [place].     Patient had surgery 06/30/20. Patient has follow up appointment 07/25/20 with surgeon. Patient was told he cannot travel for 10-12 weeks and was wondering what should be done in the mean time? Since patient cannot come back for that long period of time.    Patient is aware Dr.Dood is retired -- advised patient wife I will send message back to Dr.Swartz to review     Patient will fax over all information from   [facility] [place]  Office fax number given    Patent attorney  Phone [phone]  Fax [phone]

## 2020-07-17 NOTE — Telephone Encounter (Signed)
Attempted to call the patient. voicemail full unable to leave a message

## 2020-07-17 NOTE — Telephone Encounter (Signed)
Thanks for trying

## 2020-09-12 ENCOUNTER — Ambulatory Visit: Admit: 2020-09-12 | Discharge: 2020-09-12 | Payer: MEDICARE | Attending: Family | Primary: Family

## 2020-09-12 DIAGNOSIS — Z87891 Personal history of nicotine dependence: Secondary | ICD-10-CM

## 2020-09-12 LAB — POCT GLYCOSYLATED HEMOGLOBIN (HGB A1C): Hemoglobin A1C: 4.9 %

## 2020-09-12 MED ORDER — METFORMIN HCL ER 500 MG PO TB24
500 MG | ORAL_TABLET | Freq: Every day | ORAL | 11 refills | Status: AC
Start: 2020-09-12 — End: 2022-06-10

## 2020-09-12 MED ORDER — IPRATROPIUM-ALBUTEROL 20-100 MCG/ACT IN AERS
20-100 MCG/ACT | Freq: Four times a day (QID) | RESPIRATORY_TRACT | 11 refills | Status: AC
Start: 2020-09-12 — End: 2021-09-12

## 2020-09-12 MED ORDER — HANDICAP PLACARD
0 refills | Status: DC
Start: 2020-09-12 — End: 2020-09-27

## 2020-09-12 MED ORDER — ASPIRIN 81 MG PO TBEC
81 MG | ORAL_TABLET | Freq: Every day | ORAL | 11 refills | Status: AC
Start: 2020-09-12 — End: 2021-09-12

## 2020-09-12 MED ORDER — CARVEDILOL 12.5 MG PO TABS
12.5 MG | ORAL_TABLET | Freq: Two times a day (BID) | ORAL | 11 refills | Status: DC
Start: 2020-09-12 — End: 2020-11-27

## 2020-09-12 MED ORDER — ATORVASTATIN CALCIUM 80 MG PO TABS
80 MG | ORAL_TABLET | Freq: Every day | ORAL | 11 refills | Status: AC
Start: 2020-09-12 — End: ?

## 2020-09-12 NOTE — Patient Instructions (Signed)
What is lung cancer screening?  Lung cancer screening is a way in which doctors check the lungs for early signs of cancer in people who have no symptoms of lung cancer.  A low-dose CT scan uses much less radiation than a normal CT scan and shows a more detailed image of the lungs than a standard X-ray.  The goal of lung cancer screening is to find cancer early, before it has a chance to grow, spread, or cause problems.  One large study found that smokers who were screened with low-dose CT scans were less likely to die of lung cancer than those who were screened with standard X-ray.    Below is a summary of the things you need to know regarding screening for lung cancer with low-dose computed tomography (LDCT).  This is a screening program that involves routine annual screening with LDCT studies of the lung.  The LDCTs are done using low-dose radiation that is not thought to increase your cancer risk.  If you have other serious medical conditions (other cancers, congestive heart failure) that limit your life expectancy to less than 10 years, you should not undergo lung cancer screening with LDCT.  The chance is 20%-60% that the LDCT result will show abnormalities.  This would require additional testing which could include repeat imaging or even invasive procedures.  Most (about 95%) of "abnormal" LDCT results are false in the sense that no lung cancer is ultimately found.  Additionally, some (about 10%) of the cancers found would not affect your life expectancy, even if undetected and untreated.  If you are still smoking, the single most important thing that you can do to reduce your risk of dying of lung cancer is to quit.    For this screening to be covered by Medicare and most other insurers, strict criteria must be met.  If you do not meet these criteria, but still wish to undergo LDCT testing, you will be required to sign a waiver indicating your willingness to pay for the scan.

## 2020-09-12 NOTE — Progress Notes (Signed)
[facility]   [address]  [place] [address]  [phone]    09/12/2020     CHIEF COMPLAINT:     Nicholas Santiago (DOB:  [birth year]) is a 75 y.o. male, here for evaluation of the following chief complaint(s):  Abdominal Pain (pt is having lower lower abdominal pain when he stands up from sitting, pt had bowel surgery on 3/26 in [place]) and Established New Doctor (transferred from [facility])    CPap with 0 2 at night.     REVIEWED INFORMATION      Allergies   Allergen Reactions   ??? Pcn [Penicillins] Swelling and Rash       Current Outpatient Medications   Medication Sig Dispense Refill   ??? metoprolol succinate (TOPROL XL) 50 MG extended release tablet Take 1 tablet by mouth in the morning and at bedtime     ??? traZODone (DESYREL) 50 MG tablet Take 100 mg by mouth nightly     ??? atorvastatin (LIPITOR) 80 MG tablet Take 1 tablet by mouth daily 30 tablet 11   ??? metFORMIN (GLUCOPHAGE-XR) 500 mg extended release tablet Take 1 tablet by mouth daily 30 tablet 11   ??? carvedilol (COREG) 12.5 MG tablet Take 0.5 tablets by mouth 2 times daily 60 tablet 11   ??? aspirin 81 MG EC tablet Take 1 tablet by mouth daily 30 tablet 11   ??? albuterol-ipratropium (COMBIVENT RESPIMAT) 20-100 MCG/ACT AERS inhaler Inhale 1 puff into the lungs every 6 hours 1 each 11   ??? gabapentin (NEURONTIN) 300 MG capsule Take 1 capsule by mouth 2 times daily for 90 days. 180 capsule 0   ??? prazosin (MINIPRESS) 1 MG capsule Take 1 mg by mouth nightly 3 capsules At night     ??? doxycycline hyclate (VIBRA-TABS) 100 MG tablet Take 1 tablet by mouth 2 times daily for 10 days 20 tablet 0   ??? predniSONE (DELTASONE) 20 MG tablet Take 2 tablets by mouth daily for 5 days 10 tablet 0   ??? cloNIDine (CATAPRES) 0.1 MG tablet Take 1 tablet by mouth 2 times daily (Patient not taking: Reported on 09/12/2020) 180 tablet 0   ??? meclizine (ANTIVERT) 25 MG tablet Take 25 mg by mouth 3 times daily as needed      ??? potassium chloride (MICRO-K) 10 MEQ  extended release capsule Take 10 mEq by mouth daily (Patient not taking: Reported on 09/12/2020)     ??? furosemide (LASIX) 80 MG tablet Take 0.5 tablets by mouth daily (Patient not taking: Reported on 09/12/2020) 30 tablet 1     No current facility-administered medications for this visit.        Patient Care Team:  Mayo Ao, APRN - CNP as PCP - General (Nurse Practitioner)  Mayo Ao, APRN - CNP as PCP - [facility] Empaneled Provider    REVIEW OF SYSTEMS:     Review of Systems   Constitutional: Negative for activity change, fatigue and unexpected weight change.   HENT: Negative for congestion, ear pain, hearing loss, rhinorrhea and sore throat.    Respiratory: Negative for cough and shortness of breath.    Cardiovascular: Negative for chest pain, palpitations and leg swelling.   Gastrointestinal: Negative for constipation and diarrhea. Abdominal distention: post surgical pain.   Musculoskeletal: Negative for arthralgias and gait problem.   Neurological: Negative for dizziness, weakness and headaches.   Psychiatric/Behavioral: Negative for confusion. The patient is not nervous/anxious.  HISTORY OF PRESENT ILLNESS     Presents today      Patient is a 75 year old male presents today after referral for new patient appointment.  He was recently a patient of Dr. Yetta Barre.  Patient travels back and forth between here in [place] between the winter and summer months.  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  Patient does primary care for most of the months down there and has been followed significantly with his providers.  However upon return here he is not with any providers and is continue to have abdominal pain intermittently in the left lower quadrant.  We discussed completing a CT to make sure there has been no change since the operation.  Which will be completed as well as maintaining his  regular health maintenance at this time.    PHYSICAL EXAM:     BP 132/72 (Site: Left Upper Arm, Position: Sitting, Cuff Size: Medium Adult)    Pulse 84    Temp 96.9 ??F (36.1 ??C) (Tympanic)    Ht 5' 9.5" (1.765 m)    Wt 217 lb (98.4 kg)    SpO2 94%    BMI 31.59 kg/m??      Physical Exam  Vitals reviewed.   Constitutional:       Appearance: Normal appearance. He is not ill-appearing.   HENT:      Head: Normocephalic and atraumatic.   Eyes:      Extraocular Movements: Extraocular movements intact.      Pupils: Pupils are equal, round, and reactive to light.   Neck:      Vascular: No carotid bruit.   Cardiovascular:      Rate and Rhythm: Normal rate and regular rhythm.      Pulses: Normal pulses.      Heart sounds: Normal heart sounds.   Pulmonary:      Effort: Pulmonary effort is normal.      Breath sounds: Normal breath sounds.   Abdominal:      General: Abdomen is protuberant. There is distension.      Palpations: Abdomen is soft. There is no mass.      Tenderness: There is generalized abdominal tenderness. There is no left CVA tenderness.       Musculoskeletal:         General: No swelling or tenderness. Normal range of motion.      Cervical back: Normal range of motion and neck supple.   Skin:     General: Skin is warm.      Capillary Refill: Capillary refill takes less than 2 seconds.      Findings: No erythema or rash.   Neurological:      General: No focal deficit present.      Mental Status: He is alert and oriented to person, place, and time.   Psychiatric:         Mood and Affect: Mood normal.         Behavior: Behavior normal.          PROCEDURE/ IN OFFICE TESTING/ LAB REVIEW     No in office testing or procedures completed during today's office visit.        ASSESSMENT/PLAN/ FOLLOWUP:     PLEASE NOTE THAT ANY DISCONTINUATION OF MEDICATIONS OR MEDICAL SUPPLIES REFLECTED IN TODAY'S VISIT SUMMARY  MAY NOT HAVE COMPLETED AS A CHANGE IN YOUR PLAN OF CARE. THESE  CHANGES MAY HAVE ONLY BEEN DONE SO IN ORDER TO CLEAN  UP LIST FROM DUPLICATIONS OR MISCELLANEOUS SUPPLIES ONLY NEEDED PERIODIC REORDERS. DO NOT DISCONTINUE MEDICATIONS LISTED UNLESS SPECIFICALLY DISCUSSED IN YOUR APPOINTMENT WITH PROVIDER OR SPECIALIST, IF YOU HAVE AN QUESTIONS, PLEASE CONTACT YOUR PROVIDER FOR CLARIFICATION IF NOT ADDRESSED IN YOUR PLAN OF CARE.     1. Personal history of tobacco use  -     PR VISIT TO DISCUSS LUNG CA SCREEN W LDCT  -     CT Lung Screen (Annual); Future  2. Impaired fasting glucose  -     POCT glycosylated hemoglobin (Hb A1C)  3. Need for pneumococcal vaccination  -     Pneumococcal, PCV20, PREVNAR 20, (age 74 yrs+), IM, PF  4. Primary hypertension  -     carvedilol (COREG) 12.5 MG tablet; Take 0.5 tablets by mouth 2 times daily, Disp-60 tablet, R-11Normal  -     aspirin 81 MG EC tablet; Take 1 tablet by mouth daily, Disp-30 tablet, R-11Normal  -     CBC; Future  5. Chronic obstructive pulmonary disease, unspecified COPD type (HCC)  -     albuterol-ipratropium (COMBIVENT RESPIMAT) 20-100 MCG/ACT AERS inhaler; Inhale 1 puff into the lungs every 6 hours, Disp-1 each, R-11Normal  6. Mixed hyperlipidemia  -     atorvastatin (LIPITOR) 80 MG tablet; Take 1 tablet by mouth daily, Disp-30 tablet, R-11Normal  -     Lipid Panel; Future  -     Comprehensive Metabolic Panel, Fasting; Future  7. Type 2 diabetes mellitus without complication, without long-term current use of insulin (HCC)  -     metFORMIN (GLUCOPHAGE-XR) 500 mg extended release tablet; Take 1 tablet by mouth daily, Disp-30 tablet, R-11Normal  8. History of atrial fibrillation  Comments:  [place] [facility]  Dr. Denman GeorgeShane Bailey - completed ablation - Electrophysicologist.   Orders:  -     carvedilol (COREG) 12.5 MG tablet; Take 0.5 tablets by mouth 2 times daily, Disp-60 tablet, R-11Normal  -     AFL - Alo, Mohammed, DO, Cardiology, [facility]  9. Weakness  -     External Referral To Physical Therapy  10. Balance  problem  11. Generalized abdominal pain  -     CT ABDOMEN PELVIS WO CONTRAST Additional Contrast? None; Future  12. H/O ventral hernia repair  -     CT ABDOMEN PELVIS WO CONTRAST Additional Contrast? None; Future  13. H/O small bowel obstruction  -     CT ABDOMEN PELVIS WO CONTRAST Additional Contrast? None; Future  14. H/O abdominal surgery  -     CT ABDOMEN PELVIS WO CONTRAST Additional Contrast? None; Future      Return in about 6 weeks (around 10/24/2020).    COMMUNICATION:       On this date 09/12/2020 I have spent 60 minutes reviewing previous notes, test results and face to face with the patient discussing the diagnosis and importance of compliance with the treatment plan as well as documenting on the day of the visit.    Tim LairDr.Shane Bailey = [phone]  Dr.Brenner - cardiologist - [phone]    Dr. Marilynn RailSamberson   ???The best way to find yourself is to lose yourself in the service of others??? - [employer]      Nevin Grizzle L. Takeyla Million APRN-CNP  [facility] [place]   [email or web address]  Office: [phone]     An electronic  signature was used to authenticate this note.  Signed by Mayo Ao, APRN - CNP, APRN-CNP on 10/09/2020 at 4:50 PM

## 2020-09-12 NOTE — Progress Notes (Signed)
Low Dose CT (LDCT) Lung Screening criteria met:     Age 74-77(Medicare) or 50-80 (USPSTF)   Pack year smoking >20   Still smoking or less than 15 year since quit   No sign or symptoms of lung cancer   > 11 months since last LDCT     Risks and benefits of??lung cancer screening with LDCT scans discussed:    Significance of positive screen - False-positive LDCT results often occur.??95% of all positive results do not lead to a diagnosis of cancer. Usually further imaging can resolve most false-positive results; however, some patients may require invasive procedures.    Over diagnosis risk - 10% to 12% of screen-detected lung cancer cases are over diagnosed--that is, the cancer would not have been detected in the patient's lifetime without the screening.    Need for follow up screens annually to continue lung cancer screening effectiveness     Risks associated with radiation from annual LDCT- Radiation exposure is about the same as for a mammogram, which is about 1/3 of the annual background radiation exposure from everyday life.  Starting screening at age 68 is not likely to increase cancer risk from radiation exposure.    Patients with comorbidities resulting in life expectancy of < 10 years, or that would preclude treatment of an abnormality identified on CT, should not be screened due to lack of benefit.    To obtain maximal benefit from this screening, smoking cessation and long-term abstinence from smoking is critical

## 2020-09-14 NOTE — Telephone Encounter (Signed)
Order received for CT lung screening.  EMR and order reviewed. Information regarding ct lung screening and smoking cessation referral mailed to patient.

## 2020-09-20 ENCOUNTER — Inpatient Hospital Stay: Admit: 2020-09-20 | Payer: MEDICARE | Primary: Family

## 2020-09-20 ENCOUNTER — Inpatient Hospital Stay: Payer: MEDICARE | Primary: Family

## 2020-09-20 DIAGNOSIS — Z87891 Personal history of nicotine dependence: Secondary | ICD-10-CM

## 2020-09-20 DIAGNOSIS — E782 Mixed hyperlipidemia: Secondary | ICD-10-CM

## 2020-09-21 LAB — COMPREHENSIVE METABOLIC PANEL, FASTING
ALT: 15 U/L (ref 5–41)
AST: 17 U/L (ref <40)
Albumin/Globulin Ratio: 1.4 (ref 1.0–2.5)
Albumin: 4 g/dL (ref 3.5–5.2)
Alkaline Phosphatase: 111 U/L (ref 40–129)
Anion Gap: 14 mmol/L (ref 9–17)
BUN: 11 mg/dL (ref 8–23)
CO2: 23 mmol/L (ref 20–31)
Calcium: 9.1 mg/dL (ref 8.6–10.4)
Chloride: 103 mmol/L (ref 98–107)
Creatinine: 0.78 mg/dL (ref 0.70–1.20)
GFR African American: >60 mL/min (ref >60)
GFR Non-African American: >60 mL/min (ref >60)
Glucose, Fasting: 99 mg/dL (ref 70–99)
Potassium: 4.3 mmol/L (ref 3.7–5.3)
Sodium: 140 mmol/L (ref 135–144)
Total Bilirubin: 0.51 mg/dL (ref 0.3–1.2)
Total Protein: 6.8 g/dL (ref 6.4–8.3)

## 2020-09-21 LAB — CBC
Hematocrit: 41 % (ref 40.7–50.3)
Hemoglobin: 13 g/dL (ref 13.0–17.0)
MCH: 31.7 pg (ref 25.2–33.5)
MCHC: 31.7 g/dL (ref 28.4–34.8)
MCV: 100 fL (ref 82.6–102.9)
MPV: 10.3 fL (ref 8.1–13.5)
NRBC Automated: 0 per 100 WBC
Platelets: 236 10*3/uL (ref 138–453)
RBC: 4.1 m/uL — ABNORMAL LOW (ref 4.21–5.77)
RDW: 14.4 % (ref 11.8–14.4)
WBC: 5.6 10*3/uL (ref 3.5–11.3)

## 2020-09-21 LAB — LIPID PANEL
Chol/HDL Ratio: 2.3 (ref <5)
Cholesterol: 80 mg/dL (ref <200)
HDL: 35 mg/dL — ABNORMAL LOW (ref >40)
LDL Cholesterol: 34 mg/dL (ref 0–130)
Triglycerides: 55 mg/dL (ref <150)

## 2020-09-24 NOTE — Telephone Encounter (Signed)
Lung Navigator reviewing recent Lung Screening and radiology recommending PETand or pulmonary referral  Await review per provider. Plan to follow.

## 2020-09-26 NOTE — Telephone Encounter (Signed)
I was out of the office, when this came through. I am contacting patient within the next 24 hours.

## 2020-09-26 NOTE — Telephone Encounter (Signed)
ED Follow up Call    Reason for ED visit:  Right ear pain   Status:     LVM for patient             Did you call your PCP prior to going to the ED?        Did you receive a discharge instructions from the Emergency Room?   Review of Instructions:     Understands what to report/when to return?:     Understands discharge instructions?:     Following discharge instructions?:     If not why?     Are there any new complaints of pain?   New Pain Meds?     Constipation prophylaxis needed?      If you have a wound is the dressing clean, dry, and intact?   Understands wound care regimen?     Are there any other complaints/concerns that you wish to tell your provider?       FU appts/Provider:    Future Appointments   Date Time Provider Department Center   09/28/2020 10:00 AM Mayo Ao, APRN - CNP [email or web address]   10/24/2020 12:30 PM Mayo Ao, APRN - CNP [facility]           New Medications?:         Medication Reconciliation by phone -   Understands Medications?    Taking Medications?   Can you swallow your pills?      Any further needs in the home i.e. Equipment?      Link to services in community?:     Which services:

## 2020-09-27 ENCOUNTER — Encounter

## 2020-09-27 MED ORDER — CEPHALEXIN 500 MG PO CAPS
500 MG | ORAL_CAPSULE | Freq: Three times a day (TID) | ORAL | 0 refills | Status: AC
Start: 2020-09-27 — End: 2020-10-07

## 2020-09-27 NOTE — Progress Notes (Signed)
Spoke with patient in regards to CT lung and abdomen CT report - patient had recent repair, but evaluation with CTA is request patient is aware due to the size and evidence.     Patient has also been made aware of the CT lung result  - pet scan and pulmonary consult has been completed    Reports abdominal abcess is slowly improving - ear is not - I will start secndary antibiotic for infection today - will see tomorro for virtual appointment.

## 2020-09-28 ENCOUNTER — Telehealth: Admit: 2020-09-28 | Discharge: 2020-09-28 | Payer: MEDICARE | Attending: Family | Primary: Family

## 2020-09-28 DIAGNOSIS — H66001 Acute suppurative otitis media without spontaneous rupture of ear drum, right ear: Secondary | ICD-10-CM

## 2020-09-28 MED ORDER — CIPROFLOXACIN-DEXAMETHASONE 0.3-0.1 % OT SUSP
Freq: Two times a day (BID) | OTIC | 0 refills | Status: AC
Start: 2020-09-28 — End: 2020-10-05

## 2020-09-28 NOTE — Telephone Encounter (Signed)
Cancel order - had appointment with patient today - he has had this worked up in [place], did not notify me when he results were discussed, nor at new patient appointment

## 2020-09-28 NOTE — Telephone Encounter (Signed)
Navigator cancelled referral for PET

## 2020-09-28 NOTE — Telephone Encounter (Signed)
Lung Navigator received incoming message from provider and placing order for PET.

## 2020-09-28 NOTE — Telephone Encounter (Signed)
Just an FYI, you placed order for [facility] PET scan ? 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

## 2020-09-28 NOTE — Progress Notes (Signed)
Nicholas Santiago (DOB:  Feb 13, 1946) is a Established patient, here for evaluation of the following:  Chief Complaint   Patient presents with   ??? Otalgia     Ed f/u- [facility] on 09/22/20 for right ear pain, pain is going down into shoulder         Assessment & Plan   Below is the assessment and plan developed based on review of pertinent history, physical exam, labs, studies, and medications.  1. Non-recurrent acute suppurative otitis media of right ear without spontaneous rupture of tympanic membrane  -     ciprofloxacin-dexamethasone (CIPRODEX) 0.3-0.1 % otic suspension; Place 4 drops into the right ear 2 times daily for 7 days, Disp-7.5 mL, R-0Normal    No follow-ups on file.     Patient was initially discussed this week due to abnormal findings found on CT lung screen.  Initially ordered a PET scan and a pulmonary consult.  Patient and I discussed today he has already been following with pulmonary down in [facility] Dr. Debbe Bales, phone number [phone] with a fax number of [phone].  Reports that he has had undergone 2 lung screenings down in [place] starting in November and again in March reported that nodule size initially was estimated at 3 cm according to patient.  Most recent in our studies completed is a 1.2 cm growth.  So there has been a reduction in size.  No PET scan was ordered by previous provider as patient's platelet counts were normal and he did not feel it necessary to complete a PET scan at that time we will negate completing this PET scan until we have further information from that provider.    Also seen in [facility] for a abdominal abscess at the site of tube placement from his previous vascular surgery completed in [place].  Site was reddened and angry with drainage.  He was started on doxycycline in the emergency room.  Spoke with patient yesterday and reported there was slow improvement to this he was also experiencing a right-sided ear infection.  Patient was started with  Keflex in addition to his doxycycline yesterday.    Area still painful at this time I put him on Ciprodex as well patient is to notify me next week if he is not having any improvement we will have him in the office to get a visualization on the ear.    At this time I do would like patient to complete the CTA that I have ordered for abnormality found on the CT abdomen that was ordered due to patient's complaint of abdominal pain when he presented as a new patient 3 weeks ago.  This is a verify that there is no change to the surgical site and the patient is stable at this time.  We will await further records from [facility] as well as his further information from his surgical procedure in [place].      Subjective   Review of Systems   Constitutional: Negative for activity change, appetite change, fatigue and fever.   HENT: Positive for ear pain (right ear pain). Negative for congestion, facial swelling, sinus pressure and trouble swallowing.    Eyes: Negative for pain and visual disturbance.   Respiratory: Negative for chest tightness, shortness of breath and wheezing.    Cardiovascular: Negative for chest pain, palpitations and leg swelling.   Gastrointestinal: Negative for abdominal pain, constipation and diarrhea.   Endocrine: Negative for polydipsia, polyphagia and polyuria.   Genitourinary: Negative for  difficulty urinating, frequency and urgency.   Musculoskeletal: Negative for arthralgias and myalgias.   Skin: Negative for rash and wound.   Neurological: Negative for speech difficulty, weakness and headaches.   Hematological: Negative.    Psychiatric/Behavioral: Negative for sleep disturbance. The patient is not nervous/anxious.           Objective   Patient-Reported Vitals  No data recorded     Physical Exam  Constitutional:       Appearance: Normal appearance. He is well-developed. He is not ill-appearing.   Eyes:      General:         Right eye: No discharge.         Left eye: No discharge.       Extraocular Movements: Extraocular movements intact.      Conjunctiva/sclera: Conjunctivae normal.   Neck:      Thyroid: No thyroid mass.      Vascular: No JVD.   Pulmonary:      Effort: Pulmonary effort is normal. No respiratory distress.   Musculoskeletal:      Right shoulder: No deformity. Normal range of motion.      Left shoulder: No deformity. Normal range of motion.      Cervical back: Normal range of motion.   Skin:     General: Skin is moist.      Coloration: Skin is not cyanotic or jaundiced.      Findings: Abscess (surgical site abcess) present. No rash.          Neurological:      General: No focal deficit present.      Mental Status: He is alert and oriented to person, place, and time.      Gait: Gait is intact.   Psychiatric:         Attention and Perception: Attention normal. He does not perceive auditory or visual hallucinations.         Mood and Affect: Mood normal.         Speech: Speech normal.              On this date 09/28/2020 I have spent 30 minutes reviewing previous notes, test results and face to face (virtual) with the patient discussing the diagnosis and importance of compliance with the treatment plan as well as documenting on the day of the visit.    Nicholas Santiago, was evaluated through a synchronous (real-time) audio-video encounter. The patient (or guardian if applicable) is aware that this is a billable service, which includes applicable co-pays. This Virtual Visit was conducted with patient's (and/or legal guardian's) consent. The visit was conducted pursuant to the emergency declaration under the [employer] and the [employer], 113[employer] waiver authority and the [employer] and [employer] Act.  Patient identification was verified, and a caregiver was present when appropriate.   The patient was located at Home: [address] [place] [address].   Provider was located at [employer] (Appt Dept): [address]  [place],   [place] [address].        --Mayo Ao, APRN - CNP

## 2020-10-03 ENCOUNTER — Inpatient Hospital Stay: Admit: 2020-10-03 | Payer: MEDICARE | Primary: Family

## 2020-10-03 DIAGNOSIS — I77819 Aortic ectasia, unspecified site: Secondary | ICD-10-CM

## 2020-10-03 MED ORDER — IOPAMIDOL 76 % IV SOLN
76 % | Freq: Once | INTRAVENOUS | Status: AC | PRN
Start: 2020-10-03 — End: 2020-10-03
  Administered 2020-10-03: 12:00:00 100 mL via INTRAVENOUS

## 2020-10-03 MED ORDER — SODIUM CHLORIDE 0.9 % IV BOLUS
0.9 % | Freq: Once | INTRAVENOUS | Status: AC
Start: 2020-10-03 — End: 2020-10-03
  Administered 2020-10-03: 12:00:00 80 mL via INTRAVENOUS

## 2020-10-03 MED ORDER — NORMAL SALINE FLUSH 0.9 % IV SOLN
0.9 % | INTRAVENOUS | Status: DC | PRN
Start: 2020-10-03 — End: 2020-10-06
  Administered 2020-10-03: 12:00:00 10 mL via INTRAVENOUS

## 2020-10-03 NOTE — Telephone Encounter (Signed)
Request form faxed to [facility]

## 2020-10-03 NOTE — Telephone Encounter (Signed)
Lung Navigator needing assistance.  [name], please have [place] radiology reqest prior Lung Screening films from [place](previously worked up )

## 2020-10-03 NOTE — Telephone Encounter (Addendum)
Received call back from [facility] with [facility](p:[phone]). She is not sure if we can receive from [employer]. She advised me to call [facility] as they would know for sure as they are the ones that handle it.     I called [facility] and spoke with Carolyn([phone]). She states that we can only receive from [facility] with power share as they are the only one we are set up with as everything is through [facility]. Eber Jones states that best would be to have the disk sent to [facility] and they will upload. She is faxing me a letter sheet that has all the information needed in order for them to receive the disk. Once received I will fax to [facility].

## 2020-10-03 NOTE — Telephone Encounter (Signed)
I called [facility] to have images pushed. Spoke with Nettie Elm, who states that they use power share to push images other whys a disk will need to be sent. If disk is needed then we will need to fax request with information on where to send disk, fax number [phone]. Sylvia's direct number is [phone].  I'm not sure if we use power share and will check. I called [facility] at [phone] and had to leave a message. Message left was asking if we can receive images pushed through power share and if not for the information on where to have disk sent. Once images received we can notify [facility] to do comparison reading.

## 2020-10-09 ENCOUNTER — Ambulatory Visit: Admit: 2020-10-09 | Discharge: 2020-10-09 | Payer: MEDICARE | Attending: Family Medicine | Primary: Family

## 2020-10-09 ENCOUNTER — Encounter: Attending: Family Medicine | Primary: Family Medicine

## 2020-10-09 DIAGNOSIS — J441 Chronic obstructive pulmonary disease with (acute) exacerbation: Secondary | ICD-10-CM

## 2020-10-09 MED ORDER — PREDNISONE 20 MG PO TABS
20 MG | ORAL_TABLET | Freq: Every day | ORAL | 0 refills | Status: AC
Start: 2020-10-09 — End: 2020-10-14

## 2020-10-09 MED ORDER — DOXYCYCLINE HYCLATE 100 MG PO TABS
100 MG | ORAL_TABLET | Freq: Two times a day (BID) | ORAL | 0 refills | Status: AC
Start: 2020-10-09 — End: 2020-10-19

## 2020-10-09 NOTE — Telephone Encounter (Signed)
The patient called Marcelino Duster yesterday 10/08/20 and left a voicemail for her to call him back because he was still not feeling well. Michelle asked the writer to call the patient back to see what his symptoms were because he may need to come into the walk-in. Marcelino Duster had a virtual with the patient on 624/22 and the patient was given ear drops . Writer called the patient and he said his ears felt better on Friday but by Sunday he started not feeling well again. He said on Monday he woke up and he said the pain from his ears went to his up to his head and he was having pus coming out again. Writer advised the patient to come to the walk-in today so he can be evaluated in person. The patient stated he will come to the walk-in today.

## 2020-10-09 NOTE — Progress Notes (Signed)
Subjective:  Nicholas Santiago presents for   Chief Complaint   Patient presents with   . Cough     States that he has been sick since the middle of May. Has had ear pain. Has been on a couple different antibiotics.    . Nasal Congestion   . Other     Pt has a hole in his stomach that is infected.    . Fatigue   the abd tube was pulled 8 weeks.  He thinks they left a suture in the skin.  It is uncomfortable.  There is some discharge after the scab comes .    Cough started 6 weeks ago , the antibiotic did nothing.  No fever or change in his sob with this, but he states since surgery (in march) he has not gotten back to his old self    Hx of smoking- has copd      Patient Active Problem List   Diagnosis   . Essential hypertension   . Hypertension   . Chronic obstructive pulmonary disease, unspecified COPD type (HCC)   . Hyperlipidemia   . Type 2 diabetes mellitus         Objective:  Physical Exam   Vitals:  Wt Readings from Last 3 Encounters:   10/09/20 217 lb (98.4 kg)   09/12/20 217 lb (98.4 kg)   01/19/20 244 lb (110.7 kg)     Ht Readings from Last 3 Encounters:   09/12/20 5' 9.5" (1.765 m)   01/19/20 5\' 9"  (1.753 m)   01/04/20 5\' 9"  (1.753 m)     Body mass index is 31.59 kg/m.  Vitals:    10/09/20 1102   BP: 122/76   Site: Left Upper Arm   Position: Sitting   Cuff Size: Medium Adult   Pulse: 79   Temp: 98.3 F (36.8 C)   SpO2: 98%   Weight: 217 lb (98.4 kg)       Constitutional: He is oriented to person, place, and time.  He appears well-developed and well-nourished and in no acute distress. Answers all my questions appropriately.     Head: Normocephalic and atraumatic.     Eyes:conjunctiva appear normal.    Right Ear: External ear normal. TM is clear  Left Ear: External ear normal. TM is clear    Nose: pink, non-edematous mucosa. No polyps.  No septal deviation    Throat: no erythema, tonsillar hypertrophy or exudate.   No ulcerations noted.  Lips/Teeth/Gums all appear normal.    Neck: Normal range of motion. Neck  supple.   No tracheal deviation present.   No abnormal lymphadenopathy.    No JVD noted.  Carotids are clear bilaterally.   No thyroid masses noted.    Heart: RRR without murmur.  No S3, S4, or gallop noted.    Chest:   Decreased breath sounds  Clear to auscultation bilaterally.  No rales, wheezes, or rhonchi noted.    No respiratory retractions noted.    Wall has symmetrical movement with respirations.    abd wall - in the LUQ of the belly wall, he has a healing wound.  There is no acute erythema.  I can see no foreign body.  I feel no fluctuation.  I probed the wound and obtained no purulent discharge.    Assessment:   Encounter Diagnoses   Name Primary?   COPD exacerbation (HCC) Yes   . Weakness    . Physical debility  Plan:     There are no discontinued medications.  THE ABOVE NOTED DISCONTINUED MEDS MAY ONLY BE FROM 'CLEANING UP' THE MED LIST AND WERE NOT ACTUALLY CANCELED;  SEE CHART FOR DETAILS!  Orders Placed This Encounter   Medications   . doxycycline hyclate (VIBRA-TABS) 100 MG tablet     Sig: Take 1 tablet by mouth 2 times daily for 10 days     Dispense:  20 tablet     Refill:  0   . predniSONE (DELTASONE) 20 MG tablet     Sig: Take 2 tablets by mouth daily for 5 days     Dispense:  10 tablet     Refill:  0     No orders of the defined types were placed in this encounter.    Return in about 2 weeks (around 10/23/2020).  There are no Patient Instructions on file for this visit.  Follow up with your provider        The wound looks fine, it does not appear infected, slow to heal.  Do not use h2o2 or iodine on it.  Soap and water only in the shower.    Treating for copd excerbation - but he really didn't sound too bad.    Encouraged him to stay in PT -

## 2020-10-15 NOTE — Telephone Encounter (Signed)
Patient wishes to keep his appointment on 7/20.

## 2020-10-15 NOTE — Telephone Encounter (Signed)
-----   Message from [facility]  sent at 10/11/2020  9:54 AM EDT -----  Subject: Appointment Request    Reason for Call: Established Patient Appointment needed: Routine ED Follow   Up Visit    QUESTIONS    Reason for appointment request? No appointments available during search     Additional Information for Provider? Patient was seen in Walk in care 7/5   for infection in his port and was told to follow up with Berlinda Last   within 1 week. I only see options for Virtual Visits and patient requested   in person visit. Please call patient to schedule.   ---------------------------------------------------------------------------  --------------  Nicholas Santiago INFO  [phone]; OK to leave message on voicemail  ---------------------------------------------------------------------------  --------------  SCRIPT ANSWERS  COVID Screen: [name]

## 2020-10-24 ENCOUNTER — Ambulatory Visit: Admit: 2020-10-24 | Discharge: 2020-10-24 | Payer: MEDICARE | Attending: Family | Primary: Family

## 2020-10-24 DIAGNOSIS — T8149XA Infection following a procedure, other surgical site, initial encounter: Secondary | ICD-10-CM

## 2020-10-24 DIAGNOSIS — R42 Dizziness and giddiness: Secondary | ICD-10-CM

## 2020-10-24 NOTE — Progress Notes (Signed)
[facility]   [address]  [place] [address]  [phone]    10/24/2020     CHIEF COMPLAINT:     Nicholas Santiago (DOB:  Mar 04, 1946) is a 75 y.o. male, here for evaluation of the following chief complaint(s):  Sore (Pt states his sore on his belly is not healing) and Dizziness (Pt states he gets dizzy randomly, pt is not sure why it happended)      REVIEWED INFORMATION      Allergies   Allergen Reactions    Pcn [Penicillins] Swelling and Rash       Current Outpatient Medications   Medication Sig Dispense Refill    linezolid (ZYVOX) 600 MG tablet Take 1 tablet by mouth in the morning and 1 tablet before bedtime. Do all this for 14 days. 28 tablet 0    metoprolol succinate (TOPROL XL) 50 MG extended release tablet Take 1 tablet by mouth in the morning and at bedtime      traZODone (DESYREL) 50 MG tablet Take 100 mg by mouth nightly      atorvastatin (LIPITOR) 80 MG tablet Take 1 tablet by mouth daily 30 tablet 11    metFORMIN (GLUCOPHAGE-XR) 500 mg extended release tablet Take 1 tablet by mouth daily 30 tablet 11    carvedilol (COREG) 12.5 MG tablet Take 0.5 tablets by mouth 2 times daily (Patient taking differently: Take 12.5 mg by mouth in the morning and 12.5 mg before bedtime.) 60 tablet 11    aspirin 81 MG EC tablet Take 1 tablet by mouth daily 30 tablet 11    albuterol-ipratropium (COMBIVENT RESPIMAT) 20-100 MCG/ACT AERS inhaler Inhale 1 puff into the lungs every 6 hours 1 each 11    gabapentin (NEURONTIN) 300 MG capsule Take 1 capsule by mouth 2 times daily for 90 days. 180 capsule 0    prazosin (MINIPRESS) 1 MG capsule Take 1 mg by mouth nightly 3 capsules At night      sulfamethoxazole-trimethoprim (BACTRIM DS;SEPTRA DS) 800-160 MG per tablet Take 1 tablet by mouth in the morning and 1 tablet before bedtime. Do all this for 7 days. 14 tablet 0    cloNIDine (CATAPRES) 0.1 MG tablet Take 1 tablet by mouth 2 times daily (Patient not taking: Reported on 10/24/2020) 180 tablet 0     meclizine (ANTIVERT) 25 MG tablet Take 25 mg by mouth 3 times daily as needed  (Patient not taking: Reported on 10/24/2020)      potassium chloride (MICRO-K) 10 MEQ extended release capsule Take 10 mEq by mouth daily (Patient not taking: No sig reported)      furosemide (LASIX) 80 MG tablet Take 0.5 tablets by mouth daily (Patient not taking: No sig reported) 30 tablet 1     No current facility-administered medications for this visit.        Patient Care Team:  Mayo Ao, APRN - CNP as PCP - General (Nurse Practitioner)  Mayo Ao, APRN - CNP as PCP - [facility] Empaneled Provider    REVIEW OF SYSTEMS:     Review of Systems   Constitutional:  Negative for activity change, fatigue and unexpected weight change.   HENT:  Negative for congestion, ear pain, hearing loss, rhinorrhea and sore throat.    Respiratory:  Negative for cough and shortness of breath.    Cardiovascular:  Positive for palpitations (history of afib -nsr in office). Negative for chest pain and leg swelling.   Gastrointestinal:  Negative for  constipation and diarrhea.   Musculoskeletal:  Negative for arthralgias and gait problem.   Neurological:  Positive for dizziness and weakness. Negative for headaches.   Psychiatric/Behavioral:  Negative for confusion. The patient is not nervous/anxious.      HISTORY OF PRESENT ILLNESS         Patient reports tht he had an ablation related to AFIB after procedure. Ihe presents dizziness. oday. Notes that is started about 20 minutes  prior to appointment. Has been happening more frequently - blood glucose checked in office was 101 - he reports that he had eaten a sandwich prior to coming to office.   He is only on metformin - no insuline for his diabetes.     Patient and I also discuss his History  fAfib- with ablation in March while in [place] - no records on hand - will try to place call.     Abdominal wound still noted redness and pain - patient reports that he has increasing pus that. He reports that it  drained yesterday -  reports most pain is present at the 11:00 position.     We debrided wound today to check for tunneling - Culture.  of serous fluid today - and noted    EKG was NSR- but abnormal - patient has not followed with cardiology in [place]. I recommended that he be seen.       PHYSICAL EXAM:     BP 108/62 (Site: Left Upper Arm, Position: Sitting, Cuff Size: Medium Adult)    Pulse 51    Temp 97.4 ??F (36.3 ??C) (Tympanic)    Ht 5' 9.5" (1.765 m)    Wt 228 lb (103.4 kg)    SpO2 96%    BMI 33.19 kg/m??      Physical Exam  Vitals reviewed.   Constitutional:       Appearance: Normal appearance. He is not ill-appearing.   Cardiovascular:      Rate and Rhythm: Normal rate and regular rhythm.      Heart sounds: No murmur heard.    No friction rub. No gallop.   Pulmonary:      Effort: Pulmonary effort is normal. No respiratory distress.      Breath sounds: No wheezing.   Abdominal:      General: Bowel sounds are normal.      Palpations: Abdomen is soft.      Tenderness: There is no abdominal tenderness.   Musculoskeletal:      Right lower leg: No edema.      Left lower leg: No edema.   Skin:     General: Skin is warm.      Findings: Abscess present. No erythema, lesion or rash.          Neurological:      Mental Status: He is alert.   Psychiatric:         Mood and Affect: Mood normal.         Behavior: Behavior is cooperative.        PROCEDURE/ IN OFFICE TESTING/ LAB REVIEW     No in office testing or procedures completed during today's office visit.          ASSESSMENT/PLAN/ FOLLOWUP:     PLEASE NOTE THAT ANY DISCONTINUATION OF MEDICATIONS OR MEDICAL SUPPLIES REFLECTED IN TODAY'S VISIT SUMMARY  MAY NOT HAVE COMPLETED AS A CHANGE IN YOUR PLAN OF CARE. THESE CHANGES MAY HAVE ONLY BEEN DONE SO IN ORDER TO CLEAN UP LIST FROM DUPLICATIONS  OR MISCELLANEOUS SUPPLIES ONLY NEEDED PERIODIC REORDERS. DO NOT DISCONTINUE MEDICATIONS LISTED UNLESS SPECIFICALLY DISCUSSED IN YOUR APPOINTMENT WITH PROVIDER OR SPECIALIST, IF YOU HAVE  AN QUESTIONS, PLEASE CONTACT YOUR PROVIDER FOR CLARIFICATION IF NOT ADDRESSED IN YOUR PLAN OF CARE.     1. Dizziness  -     EKG 12 Lead  2. Irregular heart beat  -     EKG 12 Lead  3. Abdominal wall abscess at site of surgical wound  -     Culture, Aerobic and Anaerobic; Future  -     linezolid (ZYVOX) 600 MG tablet; Take 1 tablet by mouth in the morning and 1 tablet before bedtime. Do all this for 14 days., Disp-28 tablet, R-0Normal  -     Teacher, English as a foreign language [facility]    No follow-ups on file.    COMMUNICATION:       On this date 10/24/2020 I have spent 30 minutes reviewing previous notes, test results and face to face with the patient discussing the diagnosis and importance of compliance with the treatment plan as well as documenting on the day of the visit.      ???The best way to find yourself is to lose yourself in the service of others??? - [employer] L. Kayzen Kendzierski APRN-CNP  [facility] [place]   [email or web address]  Office: [phone]     An electronic signature was used to authenticate this note.  Signed by Mayo Ao, APRN - CNP, APRN-CNP on 10/29/2020 at 1:12 PM

## 2020-10-25 ENCOUNTER — Telehealth

## 2020-10-25 ENCOUNTER — Inpatient Hospital Stay: Payer: MEDICARE | Primary: Family

## 2020-10-25 MED ORDER — SULFAMETHOXAZOLE-TRIMETHOPRIM 800-160 MG PO TABS
800-160 MG | ORAL_TABLET | Freq: Two times a day (BID) | ORAL | 0 refills | Status: AC
Start: 2020-10-25 — End: 2020-11-01

## 2020-10-25 MED ORDER — LINEZOLID 600 MG PO TABS
600 MG | ORAL_TABLET | Freq: Two times a day (BID) | ORAL | 0 refills | Status: AC
Start: 2020-10-25 — End: 2020-11-08

## 2020-10-25 NOTE — Telephone Encounter (Signed)
Patient states PCP was going to prescribe stronger antibiotic than the previous doxycycline.     Patient using Nicholas Santiago.

## 2020-10-25 NOTE — Telephone Encounter (Signed)
Patient called stated Zyvox cost $3200 and can't afford the medication

## 2020-10-26 NOTE — Telephone Encounter (Signed)
I called and spoke with Tresa Endo at [facility], she states that they did receive it and the images are uploaded into PACS.

## 2020-10-26 NOTE — Telephone Encounter (Signed)
Lung navigator reviewing chart.  [name], can you check to see if records (disc) received from [place]?   Nicholas Santiago

## 2020-10-27 LAB — CULTURE, ANAEROBIC AND AEROBIC: Direct Exam: NONE SEEN

## 2020-10-29 NOTE — Telephone Encounter (Signed)
Previous lung screenings is what was requested per original message (see first message in this encounter). No other images was requested and we received what was requested.

## 2020-10-29 NOTE — Telephone Encounter (Signed)
Left detailed message at [facility] asking for a comparison read.

## 2020-10-29 NOTE — Telephone Encounter (Signed)
Nicholas Santiago, can you speak with [facility] and make sure reading radiologist is aware of additional films and comparison is made  [place]

## 2020-10-29 NOTE — Telephone Encounter (Signed)
[name], can you check on any updates for additional films please  Thanks, Janey Greaser

## 2020-10-30 ENCOUNTER — Encounter

## 2020-10-31 MED ORDER — CIPROFLOXACIN HCL 500 MG PO TABS
500 MG | ORAL_TABLET | Freq: Two times a day (BID) | ORAL | 0 refills | Status: AC
Start: 2020-10-31 — End: 2020-11-06

## 2020-11-01 ENCOUNTER — Inpatient Hospital Stay: Admit: 2020-11-01 | Discharge: 2020-11-01 | Payer: MEDICARE | Primary: Family

## 2020-11-01 DIAGNOSIS — T8189XA Other complications of procedures, not elsewhere classified, initial encounter: Secondary | ICD-10-CM

## 2020-11-01 MED ORDER — LIDOCAINE HCL URETHRAL/MUCOSAL 2 % EX PRSY
2 % | Freq: Once | CUTANEOUS | Status: AC
Start: 2020-11-01 — End: 2020-11-01
  Administered 2020-11-01: 21:00:00 6 via TOPICAL

## 2020-11-01 NOTE — Plan of Care (Signed)
Problem: Chronic Conditions and Co-morbidities  Goal: Patient's chronic conditions and co-morbidity symptoms are monitored and maintained or improved  Outcome: Progressing     Problem: Discharge Planning  Goal: Discharge to home or other facility with appropriate resources  Outcome: Progressing     Problem: Pain  Goal: Verbalizes/displays adequate comfort level or baseline comfort level  Outcome: Progressing     Problem: Safety - Adult  Goal: Free from fall injury  Outcome: Progressing     Problem: Wound:  Goal: Will show signs of wound healing; wound closure and no evidence of infection  Description: Will show signs of wound healing; wound closure and no evidence of infection  Outcome: Progressing     Problem: Falls - Risk of:  Goal: Will remain free from falls  Description: Will remain free from falls  Outcome: Progressing

## 2020-11-01 NOTE — Discharge Instructions (Addendum)
[facility] -Phone: [phone] Fax: [phone]   Visit  Discharge Instructions / Physician Orders    DATE: 11/01/2020     Home Care:      SUPPLIES ORDERED THRU:      Wound Location: Abdomen- Left Upper Quadrant     Cleanse with: Liquid antibacterial soap and water, rinse well      Dressing Orders: Cover with Dry Gauze     Frequency: Daily     Additional Orders: Increase protein to diet (meat, cheese, eggs, fish, peanut butter, nuts and beans)  Multivitamin daily  ELEVATE LEGS AS MUCH AS POSSIBLE    Your next appointment with Wound Care Center is in 1 week     (Please note your next appointment above and if you are unable to keep, kindly give a 24 hour notice. Thank you.)  If more than 15 min late we cannot guarantee you will be seen due to clinician schedule  Per Policy, Excessive cancellation will call for dismissal from program.     If you experience any of the following, please call the Wound Care Center during business hours:  [phone]  Your Phone call may be forwarded to [facility] during business hours that [facility] is closed.     * Increase in Pain  * Temperature over 101  * Increase in drainage from your wound  * Drainage with a foul odor  * Bleeding  * Increase in swelling  * Need for compression bandage changes due to slippage, breakthrough drainage.     If you need medical attention outside of the business hours of the Wound Care Centers please contact your PCP or go to the nearest emergency room.     The information contained in the After Visit Summary has been reviewed with me, the patient and/or responsible adult, by my health care provider(s). I had the opportunity to ask questions regarding this information. I have elected to receive;      [] After Visit Summary  [x] Comprehensive Discharge Instruction      Patient signature______________________________________Date:________  Electronically signed by , RN on 11/01/2020 at 1:47 PM  Electronically  signed by Ronnette Hila, APRN - CNP on 11/01/2020 at 1:50 PM

## 2020-11-01 NOTE — Progress Notes (Addendum)
[facility]   Progress Note and Procedure Note      Nicholas Santiago  MEDICAL RECORD NUMBER:  [id number]  AGE: 75 y.o.   GENDER: male  DOB: Mar 25, 1946  EPISODE DATE:  11/01/2020    Subjective:     Chief Complaint   Patient presents with    Wound Check     abdomen         HISTORY of PRESENT ILLNESS HPI     Nicholas Santiago is a 75 y.o. male who presents today for wound/ulcer evaluation.   History of Wound Context: the patient had bowel surgery in [facility] which involved the use of a bakers tube that was placed percutaneously into his stomach. The site of the previous baker's tube remains open at this time and is draining yellow drainage per the patient. He does not believe that this is bowel content. He is diabetic, but is managed well. Last A1C 4.9 in June. He was recently given Cipro by his PCP after swab cultures were taken and he states that the site is now less tender and red.     Wound/Ulcer Pain Timing/Severity: none    Wound/Ulcer Identification:  Ulcer Type: non-healing surgical  Contributing Factors: diabetes and obesity          PAST MEDICAL HISTORY        Diagnosis Date    Cerebral artery occlusion with cerebral infarction (HCC)     COPD (chronic obstructive pulmonary disease) (HCC)     Headache     Hyperlipidemia     Hypertension     PTSD (post-traumatic stress disorder)        PAST SURGICAL HISTORY    Past Surgical History:   Procedure Laterality Date    CORONARY ARTERY BYPASS GRAFT      JOINT REPLACEMENT      KNEE SURGERY Right        FAMILY HISTORY    History reviewed. No pertinent family history.    SOCIAL HISTORY    Social History     Tobacco Use    Smoking status: Former     Packs/day: 1.00     Years: 51.00     Pack years: 51.00     Types: Cigarettes     Start date: 71     Quit date: 06/26/2020     Years since quitting: 0.3    Smokeless tobacco: Never   Vaping Use    Vaping Use: Never used   Substance Use Topics    Alcohol use: Not Currently      Comment: occasionally    Drug use: No       ALLERGIES    Allergies   Allergen Reactions    Pcn [Penicillins] Swelling and Rash       MEDICATIONS    Current Outpatient Medications on File Prior to Encounter   Medication Sig Dispense Refill    ciprofloxacin (CIPRO) 500 MG tablet Take 1 tablet by mouth in the morning and 1 tablet before bedtime. Do all this for 7 days. 14 tablet 0    linezolid (ZYVOX) 600 MG tablet Take 1 tablet by mouth in the morning and 1 tablet before bedtime. Do all this for 14 days. 28 tablet 0    sulfamethoxazole-trimethoprim (BACTRIM DS;SEPTRA DS) 800-160 MG per tablet Take 1 tablet by mouth in the morning and 1 tablet before bedtime. Do all this for 7 days. 14 tablet 0    metoprolol succinate (  TOPROL XL) 50 MG extended release tablet Take 1 tablet by mouth in the morning and at bedtime      traZODone (DESYREL) 50 MG tablet Take 100 mg by mouth nightly      atorvastatin (LIPITOR) 80 MG tablet Take 1 tablet by mouth daily 30 tablet 11    metFORMIN (GLUCOPHAGE-XR) 500 mg extended release tablet Take 1 tablet by mouth daily 30 tablet 11    carvedilol (COREG) 12.5 MG tablet Take 0.5 tablets by mouth 2 times daily (Patient taking differently: Take 12.5 mg by mouth in the morning and 12.5 mg before bedtime.) 60 tablet 11    aspirin 81 MG EC tablet Take 1 tablet by mouth daily 30 tablet 11    albuterol-ipratropium (COMBIVENT RESPIMAT) 20-100 MCG/ACT AERS inhaler Inhale 1 puff into the lungs every 6 hours 1 each 11    gabapentin (NEURONTIN) 300 MG capsule Take 1 capsule by mouth 2 times daily for 90 days. 180 capsule 0    cloNIDine (CATAPRES) 0.1 MG tablet Take 1 tablet by mouth 2 times daily (Patient not taking: Reported on 10/24/2020) 180 tablet 0    meclizine (ANTIVERT) 25 MG tablet Take 25 mg by mouth 3 times daily as needed  (Patient not taking: Reported on 10/24/2020)      prazosin (MINIPRESS) 1 MG capsule Take 1 mg by mouth nightly 3 capsules At night      potassium chloride (MICRO-K) 10 MEQ  extended release capsule Take 10 mEq by mouth daily (Patient not taking: No sig reported)      furosemide (LASIX) 80 MG tablet Take 0.5 tablets by mouth daily (Patient not taking: No sig reported) 30 tablet 1     No current facility-administered medications on file prior to encounter.       REVIEW OF SYSTEMS    Constitutional: negative  Respiratory: negative  Cardiovascular: negative  Genitourinary:negative  Integument: positive for small abdominal wound near stomach  Musculoskeletal:negative  Behavioral/Psych: negative  Endocrine: positive for history of diabetes type 2  Hematologic/Immunologic: negative    Objective:      BP 127/68    Pulse 78    Temp 97.5 ??F (36.4 ??C) (Tympanic)    Resp 16    Ht 5' 9.5" (1.765 m)    Wt 228 lb (103.4 kg)    BMI 33.19 kg/m??     Wt Readings from Last 3 Encounters:   11/01/20 228 lb (103.4 kg)   10/24/20 228 lb (103.4 kg)   10/09/20 217 lb (98.4 kg)       PHYSICAL EXAM    General Appearance: alert and oriented to person, place and time, well-developed and well-nourished, in no acute distress  Skin: See below  Head: normocephalic and atraumatic  Pulmonary/Chest: normal air movement, no respiratory distress  Extremities: no cyanosis and no clubbing or edema   Musculoskeletal: no joint swelling, deformity or tenderness  Neurologic: gait, coordination normal and speech normal      Assessment:     Problem List Items Addressed This Visit          Other    H/O gastrostomy    Open abdominal wall wound, sequela        Procedure Note  Silver  nitrate chemical cautery used on small upper abdominal wound.     Wound 11/01/20 Abdomen Left;Upper;Quadrant #1 (Active)   Wound Image   11/01/20 1309   Wound Etiology Non-Healing Surgical 11/01/20 1309   Dressing Status Old drainage noted;New drainage noted 11/01/20  1309   Wound Length (cm) 0.2 cm 11/01/20 1309   Wound Width (cm) 0.2 cm 11/01/20 1309   Wound Depth (cm) 2.8 cm 11/01/20 1309   Wound Surface Area (cm^2) 0.04 cm^2 11/01/20 1309   Wound  Volume (cm^3) 0.112 cm^3 11/01/20 1309   Post-Procedure Length (cm) 0.2 cm 11/01/20 1309   Post-Procedure Width (cm) 0.2 cm 11/01/20 1309   Post-Procedure Depth (cm) 2.8 cm 11/01/20 1309   Post-Procedure Surface Area (cm^2) 0.04 cm^2 11/01/20 1309   Post-Procedure Volume (cm^3) 0.112 cm^3 11/01/20 1309   Wound Assessment Pink/red 11/01/20 1309   Drainage Amount Moderate 11/01/20 1309   Drainage Description Serosanguinous 11/01/20 1309   Odor None 11/01/20 1309   Peri-wound Assessment Blanchable erythema 11/01/20 1309   Margins Defined edges 11/01/20 1309   Wound Thickness Description not for Pressure Injury Full thickness 11/01/20 1309   Number of days: 0        Very small open wound on left upper abdomen near stomach. The wound bed is 100% pink granular. The wound tunnels inward, likely a tract to the stomach.              Plan:     -Continue oral Cipra    -Use dry sterile gauze changed as needed to manage any drainage    -Silver nitrate cautery completed to pinhole opening this visit    -Follow up one week, if continues may consider gen surgery consult    -ADDENDUM:  -spoke with Dr. Mickel Crow for advice regarding treatment of Baker's tube gastrostomy and he advised that the patient be seen in the general surgery clinic for closure. A referral was placed and a voicemail message was left with the patient for contact information to make that appointment.    -See Discharge Instructions        Written patient dismissal instructions given to patient and signed by patient or POA.           Electronically signed by Efrain Sella, APRN - CNP on 11/01/2020 at 1:52 PM

## 2020-11-06 ENCOUNTER — Ambulatory Visit: Admit: 2020-11-06 | Discharge: 2020-11-06 | Payer: MEDICARE | Attending: Surgery | Primary: Family

## 2020-11-06 DIAGNOSIS — L905 Scar conditions and fibrosis of skin: Secondary | ICD-10-CM

## 2020-11-06 NOTE — Progress Notes (Signed)
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  male who presents for evaluation for the above. PSH of remote ventral hernia repair with mesh, this was complicated by SBO that was treated with ex lap and mesh excision in [place] 06/2020, pt had gastrotomy and Baker's tube placed. The tube was eventually removed but pt eventually began to complain of pain/drainage from the tube site, he was evaluated at [facility] wound care clinic and referred to me for further evaluation.    Pt reports he has been compliant on abx regimen, he was given bactrim at first and cipro was added secondarily. Pt reports pain and drainage have now resolved. No other complaints at this time.     Chart review performed to add information to the HPI: Yes    Past Medical History   has a past medical history of Cerebral artery occlusion with cerebral infarction (HCC), COPD (chronic obstructive pulmonary disease) (HCC), Headache, Hyperlipidemia, Hypertension, and PTSD (post-traumatic stress disorder).    Past Surgical History   has a past surgical history that includes knee surgery (Right); Coronary artery bypass graft; and joint replacement.    Family History  family history is not on file.    Social History  Tobacco use:  reports that he quit smoking about 4 months ago. His smoking use included cigarettes. He started smoking about 52 years ago. He has a 51.00 pack-year smoking history. He has never used smokeless tobacco.  Alcohol use:  reports that he does not currently use alcohol.  Drug use:  reports no history of drug use.      Medications  Current Medications:   Current Outpatient Medications   Medication Sig Dispense Refill    ciprofloxacin (CIPRO)  500 MG tablet Take 1 tablet by mouth in the morning and 1 tablet before bedtime. Do all this for 7 days. 14 tablet 0    linezolid (ZYVOX) 600 MG tablet Take 1 tablet by mouth in the morning and 1 tablet before bedtime. Do all this for 14 days. 28 tablet 0    metoprolol succinate (TOPROL XL) 50 MG extended release tablet Take 1 tablet by mouth in the morning and at bedtime      traZODone (DESYREL) 50 MG tablet Take 100 mg by mouth nightly      atorvastatin (LIPITOR) 80 MG tablet Take 1 tablet by mouth daily 30 tablet 11    metFORMIN (GLUCOPHAGE-XR) 500 mg extended release tablet Take 1 tablet by mouth daily 30 tablet 11    carvedilol (COREG) 12.5 MG tablet Take 0.5 tablets by mouth 2 times daily (Patient taking differently: Take 12.5 mg by mouth in the morning and 12.5 mg before bedtime.) 60 tablet 11    aspirin 81 MG EC tablet Take 1 tablet by mouth daily 30 tablet 11    albuterol-ipratropium (COMBIVENT RESPIMAT) 20-100 MCG/ACT AERS inhaler Inhale 1 puff into the lungs every 6 hours 1 each 11    gabapentin (NEURONTIN) 300 MG capsule Take 1 capsule by mouth 2 times daily for 90 days. 180 capsule 0    cloNIDine (CATAPRES) 0.1 MG tablet Take 1 tablet by  mouth 2 times daily 180 tablet 0    meclizine (ANTIVERT) 25 MG tablet Take 25 mg by mouth 3 times daily as needed      prazosin (MINIPRESS) 1 MG capsule Take 1 mg by mouth nightly 3 capsules At night      potassium chloride (MICRO-K) 10 MEQ extended release capsule Take 10 mEq by mouth in the morning.      furosemide (LASIX) 80 MG tablet Take 0.5 tablets by mouth daily 30 tablet 1     No current facility-administered medications for this visit.     Home Medications:   Prior to Admission medications    Medication Sig Start Date End Date Taking? Authorizing Provider   ciprofloxacin (CIPRO) 500 MG tablet Take 1 tablet by mouth in the morning and 1 tablet before bedtime. Do all this for 7 days. 10/30/20 11/06/20 Yes Mayo Ao, APRN - CNP   linezolid (ZYVOX) 600 MG  tablet Take 1 tablet by mouth in the morning and 1 tablet before bedtime. Do all this for 14 days. 10/25/20 11/08/20 Yes Mayo Ao, APRN - CNP   metoprolol succinate (TOPROL XL) 50 MG extended release tablet Take 1 tablet by mouth in the morning and at bedtime 08/01/20  Yes Historical Provider, MD   traZODone (DESYREL) 50 MG tablet Take 100 mg by mouth nightly   Yes Historical Provider, MD   atorvastatin (LIPITOR) 80 MG tablet Take 1 tablet by mouth daily 09/12/20  Yes Mayo Ao, APRN - CNP   metFORMIN (GLUCOPHAGE-XR) 500 mg extended release tablet Take 1 tablet by mouth daily 09/12/20 06/10/22 Yes Mayo Ao, APRN - CNP   carvedilol (COREG) 12.5 MG tablet Take 0.5 tablets by mouth 2 times daily  Patient taking differently: Take 12.5 mg by mouth in the morning and 12.5 mg before bedtime. 09/12/20  Yes Mayo Ao, APRN - CNP   aspirin 81 MG EC tablet Take 1 tablet by mouth daily 09/12/20 09/12/21 Yes Mayo Ao, APRN - CNP   albuterol-ipratropium (COMBIVENT RESPIMAT) 20-100 MCG/ACT AERS inhaler Inhale 1 puff into the lungs every 6 hours 09/12/20 09/12/21 Yes Mayo Ao, APRN - CNP   gabapentin (NEURONTIN) 300 MG capsule Take 1 capsule by mouth 2 times daily for 90 days. 01/26/20 09/12/21 Yes Marlou Starks, MD   cloNIDine (CATAPRES) 0.1 MG tablet Take 1 tablet by mouth 2 times daily 01/26/20 10/24/21 Yes Marlou Starks, MD   meclizine (ANTIVERT) 25 MG tablet Take 25 mg by mouth 3 times daily as needed   Yes Historical Provider, MD   prazosin (MINIPRESS) 1 MG capsule Take 1 mg by mouth nightly 3 capsules At night   Yes Historical Provider, MD   potassium chloride (MICRO-K) 10 MEQ extended release capsule Take 10 mEq by mouth in the morning.   Yes Historical Provider, MD   furosemide (LASIX) 80 MG tablet Take 0.5 tablets by mouth daily 12/14/19  Yes Leland Johns, MD       Allergies  Pcn [penicillins]      Review of Systems:  General: Denies any fever, chills.  Eyes: Denies any changes in  vision, diplopia or eye pain  Ears, Nose, Mouth: Denies changes in hearing/tinnitus or drainage from ears, no rhinorrhea or bloody nose, no difficulty chewing  Throat: no difficulty swallowing, no throat pain  Respiratory: Denies any shortness of breath or cough.  Cardiac: Denies any chest pain, palpitations, claudication or edema.  Gastrointestinal:  Denies any melena,  hematochezia, hematemesis or pyrosis.  Genitourinary: Denies any frequency, urgency, hesitancy or incontinence.  Musculoskeletal: Denies worsening muscle weakness or recent trauma  Skin: Denies rashes or lesions  Psychiatric: Denies any recent changes in mood or affect  Hematologic: Denies bruising or bleeding easily.    PHYSICAL EXAMINATION  Vitals:   Vitals:    11/06/20 0912   Pulse: 90   Resp: 16   SpO2: 100%       General Appearance:  awake, alert, no acute distress, well developed, well nourished   Skin:  scarred gastrostomy site with no evidence of wound or drainage.  Head/face:  NCAT, face symmetrical  Eyes:  PERRL, no evidence of conjunctivitis or ptosis bilaterally  Ears:  External ears and canals grossly normal, no evidence of otorrhea.   Nose/Sinuses:  Nares normal. Septum midline. Mucosa normal. No external drainage noted.  Mouth/Neck:  Mucosa moist.  No external oral lesions. Trachea midline. No visible masses.   Lungs:  Normal chest expansion, unlabored breathing without accessory muscle use.   No audible rales, rhonchi, or wheezing.  Cardiovascular: S1S2. No evidence of JVD. No evidence of pulsatile masses in abdomen  Abdomen:  Soft, non-tender, no organomegaly, no masses.  Musculoskeletal: No evidence of bony/muscular deformities, trauma, atrophy of either left/right upper/lower extremity. No evidence of digital clubbing or cyanosis.  Neurologic:  CN 2-12 grossly intact without obvious deficits. Grossly normal sensation in all extremities.  Psychiatric: appropriate judgement and insight, appropriate recall of recent and remote memory,  no evidence of depression/anxiety/agitation    DIAGNOSES:   Diagnosis Orders   1. Scar of abdominal wall            PLAN:  Clinical history is consistent with leakage at the gastrostomy tube site, now appears to have closed. Continue nonoperative management for now, advised pt to finish antibiotic course. Can RTC if he sees additional drainage at which point I have recommended proceed to OR for excision of the scarred skin and gastrotomy closure. All questions were answered, pt is agreeable to this plan.        Medical Decision Making: low complexity    Electronically signed by Earl Many, DO on 11/06/2020 at 9:37 AM

## 2020-11-07 NOTE — Discharge Instructions (Signed)
[facility] -Phone: [phone] Fax: [phone]    Visit  Discharge Instructions / Physician Orders     DATE: 11/07/2020     Home Care:     SUPPLIES ORDERED THRU:     Wound Location: Abdomen-Left Upper Quadrant     Cleanse with: Liquid antibacterial soap and water, rinse well      Dressing Orders: Cover with Dry Gauze     Frequency: Daily     Additional Orders: Increase protein to diet (meat, cheese, eggs, fish, peanut butter, nuts and beans)  Multivitamin daily  ELEVATE LEGS AS MUCH AS POSSIBLE     Your next appointment with Wound Care Center is in 1 week     (Please note your next appointment above and if you are unable to keep, kindly give a 24 hour notice. Thank you.)  If more than 15 min late we cannot guarantee you will be seen due to clinician schedule  Per Policy, Excessive cancellation will call for dismissal from program.     If you experience any of the following, please call the Wound Care Center during business hours:  [phone]  Your Phone call may be forwarded to [facility] during business hours that [facility] is closed.     * Increase in Pain  * Temperature over 101  * Increase in drainage from your wound  * Drainage with a foul odor  * Bleeding  * Increase in swelling  * Need for compression bandage changes due to slippage, breakthrough drainage.     If you need medical attention outside of the business hours of the Wound Care Centers please contact your PCP or go to the nearest emergency room.     The information contained in the After Visit Summary has been reviewed with me, the patient and/or responsible adult, by my health care provider(s). I had the opportunity to ask questions regarding this information. I have elected to receive;      [] After Visit Summary  [x] Comprehensive Discharge Instruction        Patient signature______________________________________Date:________  Electronically signed by , RN on 11/07/2020 at 2:08 PM

## 2020-11-09 ENCOUNTER — Inpatient Hospital Stay: Primary: Family

## 2020-11-13 MED ORDER — GABAPENTIN 300 MG PO CAPS
300 MG | ORAL_CAPSULE | Freq: Two times a day (BID) | ORAL | 0 refills | Status: AC
Start: 2020-11-13 — End: 2021-02-11

## 2020-11-13 NOTE — Telephone Encounter (Signed)
Last visit: 10/24/2020  Last Med refill: 01/26/2020  Does patient have enough medication for 72 hours: no  Patient was unaware he was out of refills. 90 day supply if possible.    Next Visit Date:11/27/2020  Future Appointments   Date Time Provider Department Center   11/27/2020 11:00 AM Mayo Ao, APRN - CNP [id number]       Health Maintenance   Topic Date Due    Annual Wellness Visit (AWV)  Never done    Diabetic foot exam  Never done    Diabetic microalbuminuria test  Never done    Diabetic retinal exam  Never done    DTaP/Tdap/Td vaccine (1 - Tdap) Never done    Shingles vaccine (1 of 2) Never done    COVID-19 Vaccine (3 - Booster for Pfizer series) 03/14/2021 (Originally 01/02/2020)    Hepatitis C screen  09/12/2021 (Originally 12/13/1963)    Flu vaccine (1) 12/06/2020    Colorectal Cancer Screen  07/07/2021    A1C test (Diabetic or Prediabetic)  09/12/2021    Lipids  09/20/2021    Low dose CT lung screening  09/20/2021    Depression Screen  10/24/2021    Pneumococcal 65+ years Vaccine  Completed    AAA screen  Completed    Hepatitis A vaccine  Aged Out    Hib vaccine  Aged Out    Meningococcal (ACWY) vaccine  Aged Out       Hemoglobin A1C (%)   Date Value   09/12/2020 4.9   12/14/2019 6.6 (H)             ( goal A1C is < 7)   No results found for: LABMICR  LDL Cholesterol (mg/dL)   Date Value   63/14/9702 34       (goal LDL is <100)   AST (U/L)   Date Value   09/20/2020 17     ALT (U/L)   Date Value   09/20/2020 15     BUN (mg/dL)   Date Value   63/78/5885 11     BP Readings from Last 3 Encounters:   11/01/20 127/68   10/24/20 108/62   10/09/20 122/76          (goal 120/80)    All Future Testing planned in [facility]  Lab Frequency Next Occurrence   Sedimentation Rate Once 08/04/2020   PET CT AXUMIN SCAN Once 09/27/2020               Patient Active Problem List:     Essential hypertension     Hypertension     Chronic obstructive pulmonary disease, unspecified COPD type (HCC)     Hyperlipidemia      Type 2 diabetes mellitus     H/O gastrostomy     Open abdominal wall wound, sequela

## 2020-11-26 ENCOUNTER — Inpatient Hospital Stay: Payer: MEDICARE | Primary: Family

## 2020-11-26 ENCOUNTER — Ambulatory Visit: Admit: 2020-11-26 | Discharge: 2020-11-26 | Payer: MEDICARE | Attending: Family Medicine | Primary: Family

## 2020-11-26 DIAGNOSIS — R42 Dizziness and giddiness: Secondary | ICD-10-CM

## 2020-11-26 LAB — COMPREHENSIVE METABOLIC PANEL
ALT: 17 U/L (ref 5–41)
AST: 15 U/L (ref <40)
Albumin/Globulin Ratio: 1.8 (ref 1.0–2.5)
Albumin: 3.9 g/dL (ref 3.5–5.2)
Alkaline Phosphatase: 95 U/L (ref 40–129)
Anion Gap: 12 mmol/L (ref 9–17)
BUN: 12 mg/dL (ref 8–23)
CO2: 27 mmol/L (ref 20–31)
Calcium: 8.7 mg/dL (ref 8.6–10.4)
Chloride: 104 mmol/L (ref 98–107)
Creatinine: 0.86 mg/dL (ref 0.70–1.20)
GFR African American: >60 mL/min (ref >60)
GFR Non-African American: >60 mL/min (ref >60)
Glucose: 132 mg/dL — ABNORMAL HIGH (ref 70–99)
Potassium: 4 mmol/L (ref 3.7–5.3)
Sodium: 143 mmol/L (ref 135–144)
Total Bilirubin: 0.53 mg/dL (ref 0.3–1.2)
Total Protein: 6.1 g/dL — ABNORMAL LOW (ref 6.4–8.3)

## 2020-11-26 LAB — POCT GLUCOSE: Glucose: 164 mg/dL

## 2020-11-26 LAB — CBC WITH AUTO DIFFERENTIAL
Absolute Eos #: 0.18 10*3/uL (ref 0.00–0.44)
Absolute Immature Granulocyte: 0.05 10*3/uL (ref 0.00–0.30)
Absolute Lymph #: 1.33 10*3/uL (ref 1.10–3.70)
Absolute Mono #: 0.37 10*3/uL (ref 0.10–1.20)
Basophils Absolute: <0.03 10*3/uL (ref 0.00–0.20)
Basophils: 0 % (ref 0–2)
Eosinophils %: 4 % (ref 1–4)
Hematocrit: 41.2 % (ref 40.7–50.3)
Hemoglobin: 13.2 g/dL (ref 13.0–17.0)
Immature Granulocytes: 1 % — ABNORMAL HIGH
Lymphocytes: 26 % (ref 24–43)
MCH: 32.2 pg (ref 25.2–33.5)
MCHC: 32 g/dL (ref 28.4–34.8)
MCV: 100.5 fL (ref 82.6–102.9)
MPV: 10.9 fL (ref 8.1–13.5)
Monocytes: 7 % (ref 3–12)
NRBC Automated: 0 per 100 WBC
Platelets: 200 10*3/uL (ref 138–453)
RBC: 4.1 m/uL — ABNORMAL LOW (ref 4.21–5.77)
RDW: 14.9 % — ABNORMAL HIGH (ref 11.8–14.4)
Seg Neutrophils: 62 % (ref 36–65)
Segs Absolute: 3.1 10*3/uL (ref 1.50–8.10)
WBC: 5.1 10*3/uL (ref 3.5–11.3)

## 2020-11-26 NOTE — Progress Notes (Signed)
Subjective:  Nicholas Santiago presents for   Chief Complaint   Patient presents with    Dizziness     This all started yesterday. Very lightheaded- dizzy, clammy. Has a spot on his stomach from a previous procedure that was done in [place]- he states that it still feels sore and infected. He is supposed to be on o2 but he cant use it bc he cant take it anywhere with him.     Abdominal Pain    Headache     No fevers.  No illnesses  Room is not spinning, but his balance is off a bit whe he gets up.    He has not fallen.    Uses oxygne at night only for sleep apnea  Had some diarrhea yesterday.  Fluids are good    Appetite is good    No cp or new sob  Patient Active Problem List   Diagnosis    Essential hypertension    Hypertension    Chronic obstructive pulmonary disease, unspecified COPD type (HCC)    Hyperlipidemia    Type 2 diabetes mellitus    H/O gastrostomy    Open abdominal wall wound, sequela         Objective:  Physical Exam   Vitals:  Wt Readings from Last 3 Encounters:   11/26/20 228 lb (103.4 kg)   11/06/20 228 lb (103.4 kg)   11/01/20 228 lb (103.4 kg)     Ht Readings from Last 3 Encounters:   11/06/20 5\' 9"  (1.753 m)   11/01/20 5' 9.5" (1.765 m)   10/24/20 5' 9.5" (1.765 m)     Body mass index is 33.67 kg/m.  Vitals:    11/26/20 1208   BP: (!) 168/88   Site: Left Upper Arm   Position: Sitting   Cuff Size: Large Adult   Pulse: 64   Temp: 97.9 F (36.6 C)   SpO2: 90%   Weight: 228 lb (103.4 kg)       Constitutional: He is oriented to person, place, and time.  He appears well-developed and well-nourished and in no acute distress. Answers all my questions appropriately.     Head: Normocephalic and atraumatic.     Eyes:conjunctiva appear normal.    Right Ear: External ear normal. TM is clear  Left Ear: External ear normal. TM is clear    Nose: pink, non-edematous mucosa. No polyps.  No septal deviation    Throat: no erythema, tonsillar hypertrophy or exudate.   No ulcerations noted.  Lips/Teeth/Gums all appear  normal.    Neck: Normal range of motion. Neck supple.   No tracheal deviation present.   No abnormal lymphadenopathy.    No JVD noted.  Carotids are clear bilaterally.   No thyroid masses noted.    Heart: RRR without murmur.  No S3, S4, or gallop noted.    Chest:   Good breath sounds noted.   Clear to auscultation bilaterally.  No rales, wheezes, or rhonchi noted.    No respiratory retractions noted.    Wall has symmetrical movement with respirations.    Abdomen: No distension noted.  + bowel sounds in all quadrants which are normoactive.  No bruits noted.  No masses could be palpated.  No unusual pulsatile masses noted.  To deep palpation, patient denied any significant pain.  No rebound, guarding or rigidity noted to my exam.      Abd wall wound appears closed.  No discharge present    Neg romberg.  Easily got up out of the chair.      I walked with him up and down the hallway and he was stable.  Assessment:   Encounter Diagnoses   Name Primary?    Dizziness Yes    Diarrhea, unspecified type          Plan:     There are no discontinued medications.  THE ABOVE NOTED DISCONTINUED MEDS MAY ONLY BE FROM 'CLEANING UP' THE MED LIST AND WERE NOT ACTUALLY CANCELED;  SEE CHART FOR DETAILS!  No orders of the defined types were placed in this encounter.    Orders Placed This Encounter   Procedures    CBC with Auto Differential     Standing Status:   Future     Number of Occurrences:   1     Standing Expiration Date:   11/26/2021    Comprehensive Metabolic Panel     Standing Status:   Future     Number of Occurrences:   1     Standing Expiration Date:   11/26/2021    POCT Glucose     Return in about 2 weeks (around 12/10/2020).  There are no Patient Instructions on file for this visit.  Follow up with your provider        Push fluids.    Move slowly  Stay out of the heat.    Clinically this patient appears stable.

## 2020-11-27 ENCOUNTER — Ambulatory Visit: Admit: 2020-11-27 | Discharge: 2020-11-27 | Payer: MEDICARE | Attending: Family | Primary: Family

## 2020-11-27 DIAGNOSIS — R42 Dizziness and giddiness: Secondary | ICD-10-CM

## 2020-11-27 MED ORDER — MECLIZINE HCL 25 MG PO TABS
25 MG | ORAL_TABLET | Freq: Three times a day (TID) | ORAL | 5 refills | Status: AC | PRN
Start: 2020-11-27 — End: 2020-12-27

## 2020-11-27 MED ORDER — CLONIDINE HCL 0.1 MG PO TABS
0.1 MG | ORAL_TABLET | Freq: Two times a day (BID) | ORAL | 0 refills | Status: AC
Start: 2020-11-27 — End: 2021-02-25

## 2020-11-27 NOTE — Progress Notes (Signed)
[facility]   [address]  [place] [address]  [phone]    11/27/2020     CHIEF COMPLAINT:     Nicholas Santiago (DOB:  1946/02/15) is a 75 y.o. male, here for evaluation of the following chief complaint(s):  Dizziness (Pt states he is always dizzy), Headache (Headache on back on the patients head on the right side), and Abdominal Pain (Lower left abdominal pain)      REVIEWED INFORMATION      Allergies   Allergen Reactions    Pcn [Penicillins] Swelling and Rash       Current Outpatient Medications   Medication Sig Dispense Refill    meclizine (ANTIVERT) 25 MG tablet Take 1 tablet by mouth 3 times daily as needed for Dizziness 60 tablet 5    cloNIDine (CATAPRES) 0.1 MG tablet Take 1 tablet by mouth 2 times daily 180 tablet 0    gabapentin (NEURONTIN) 300 MG capsule Take 1 capsule by mouth in the morning and 1 capsule before bedtime. Do all this for 90 days. 180 capsule 0    metoprolol succinate (TOPROL XL) 50 MG extended release tablet Take 1 tablet by mouth in the morning and at bedtime      traZODone (DESYREL) 50 MG tablet Take 100 mg by mouth nightly      atorvastatin (LIPITOR) 80 MG tablet Take 1 tablet by mouth daily 30 tablet 11    metFORMIN (GLUCOPHAGE-XR) 500 mg extended release tablet Take 1 tablet by mouth daily 30 tablet 11    aspirin 81 MG EC tablet Take 1 tablet by mouth daily 30 tablet 11    albuterol-ipratropium (COMBIVENT RESPIMAT) 20-100 MCG/ACT AERS inhaler Inhale 1 puff into the lungs every 6 hours 1 each 11     No current facility-administered medications for this visit.        Patient Care Team:  Mayo Ao, APRN - CNP as PCP - General (Nurse Practitioner)  Mayo Ao, APRN - CNP as PCP - [facility] Empaneled Provider    REVIEW OF SYSTEMS:     Review of Systems   Constitutional:  Positive for activity change and fatigue. Negative for unexpected weight change.   HENT:  Negative for congestion, ear pain, hearing loss, rhinorrhea and sore throat.    Respiratory:   Negative for cough and shortness of breath.    Cardiovascular:  Negative for chest pain, palpitations and leg swelling.   Gastrointestinal:  Negative for constipation and diarrhea.   Musculoskeletal:  Negative for arthralgias and gait problem.   Neurological:  Positive for dizziness. Negative for weakness and headaches.   Psychiatric/Behavioral:  Negative for confusion. The patient is not nervous/anxious.      HISTORY OF PRESENT ILLNESS     Patient presents today for follow-up for wound.  He has been seen by wound clinic it does appear to be healing well.  He did undergo a slight debridement of the wound.  And has been monitored overall improvement.      He does report that he is feeling very dizzy today.  Does have a history of vertigo.  Was recently seen by cardiology and review of his medications show significant changes in what he should be taking.  Below is a list of everything that was discontinued by his cardiologist and according to the most recent note.  We have also placed calls with her as pharmacies to evaluate what medications he is to be taking list has been adjusted.  Patient has been directed to discontinue carvedilol as well as any other medications listed below.  I will resume his meclizine for which she has not received medication for in several months.  Clonidine is a medication that cardiology has him continued on however patient reports he is not taking at this time.  I will have him hold resumption of this medication until we have a good blood pressure on him.  But he is to stop all of the other medications as indicated on his discontinuation list.    All of these medications were noted as discontinued in hs last appointment with cardiology on 11/15/2020  Medications Discontinued During This Encounter   Medication Reason    atenoloL (TENORMIN) 25 mg tablet    oxyCODONE-acetaminophen (PERCOCET) 5-325 mg per tablet    potassium chloride (MICRO-K) 10 MEQ CR capsule    furosemide (LASIX) 80 mg  tablet    prazosin (MINIPRESS) 1 mg capsule    meclizine (ANTIVERT) 25 mg tablet    carvediloL (COREG) 12.5 mg tablet     PHYSICAL EXAM:     BP 102/62 (Site: Left Upper Arm, Position: Standing, Cuff Size: Medium Adult)    Pulse 62    Temp 97.3 ??F (36.3 ??C) (Tympanic)    Ht 5' 9.5" (1.765 m)    Wt 221 lb (100.2 kg)    SpO2 96%    BMI 32.17 kg/m??      Physical Exam  Vitals reviewed.   Constitutional:       Appearance: Normal appearance. He is not ill-appearing.   Cardiovascular:      Rate and Rhythm: Normal rate and regular rhythm.      Heart sounds: No murmur heard.    No friction rub. No gallop.   Pulmonary:      Effort: Pulmonary effort is normal. No respiratory distress.      Breath sounds: No wheezing.   Abdominal:      General: Bowel sounds are normal.      Palpations: Abdomen is soft.      Tenderness: There is no abdominal tenderness.   Musculoskeletal:      Right lower leg: No edema.      Left lower leg: No edema.   Skin:     General: Skin is warm.      Findings: No erythema, lesion or rash.   Neurological:      Mental Status: He is alert.   Psychiatric:         Mood and Affect: Mood normal.         Behavior: Behavior is cooperative.        PROCEDURE/ IN OFFICE TESTING/ LAB REVIEW     No in office testing or procedures completed during today's office visit.     Results for orders placed or performed during the hospital encounter of 11/26/20 (from the past 168 hour(s))   Comprehensive Metabolic Panel    Collection Time: 11/26/20 12:35 PM   Result Value Ref Range    Glucose 132 (H) 70 - 99 mg/dL    BUN 12 8 - 23 mg/dL    Creatinine 1.610.86 0.960.70 - 1.20 mg/dL    Calcium 8.7 8.6 - 04.510.4 mg/dL    Sodium 409143 811135 - 914144 mmol/L    Potassium 4.0 3.7 - 5.3 mmol/L    Chloride 104 98 - 107 mmol/L    CO2 27 20 - 31 mmol/L    Anion Gap 12 9 - 17 mmol/L    Alkaline  Phosphatase 95 40 - 129 U/L    ALT 17 5 - 41 U/L    AST 15 <40 U/L    Total Bilirubin 0.53 0.3 - 1.2 mg/dL    Total Protein 6.1 (L) 6.4 - 8.3 g/dL    Albumin 3.9 3.5 -  5.2 g/dL    Albumin/Globulin Ratio 1.8 1.0 - 2.5    GFR Non-African American >60 >60 mL/min    GFR African American >60 >60 mL/min    GFR Comment         CBC with Auto Differential    Collection Time: 11/26/20 12:35 PM   Result Value Ref Range    WBC 5.1 3.5 - 11.3 k/uL    RBC 4.10 (L) 4.21 - 5.77 m/uL    Hemoglobin 13.2 13.0 - 17.0 g/dL    Hematocrit 54.0 98.1 - 50.3 %    MCV 100.5 82.6 - 102.9 fL    MCH 32.2 25.2 - 33.5 pg    MCHC 32.0 28.4 - 34.8 g/dL    RDW 19.1 (H) 47.8 - 14.4 %    Platelets 200 138 - 453 k/uL    MPV 10.9 8.1 - 13.5 fL    NRBC Automated 0.0 0.0 per 100 WBC    Seg Neutrophils 62 36 - 65 %    Lymphocytes 26 24 - 43 %    Monocytes 7 3 - 12 %    Eosinophils % 4 1 - 4 %    Basophils 0 0 - 2 %    Immature Granulocytes 1 (H) 0 %    Segs Absolute 3.10 1.50 - 8.10 k/uL    Absolute Lymph # 1.33 1.10 - 3.70 k/uL    Absolute Mono # 0.37 0.10 - 1.20 k/uL    Absolute Eos # 0.18 0.00 - 0.44 k/uL    Basophils Absolute <0.03 0.00 - 0.20 k/uL    Absolute Immature Granulocyte 0.05 0.00 - 0.30 k/uL    RBC Morphology ANISOCYTOSIS PRESENT    Results for orders placed or performed in visit on 11/26/20 (from the past 168 hour(s))   POCT Glucose    Collection Time: 11/26/20 12:00 PM   Result Value Ref Range    Glucose 164 mg/dL    QC OK? yes         ASSESSMENT/PLAN/ FOLLOWUP:     PLEASE NOTE THAT ANY DISCONTINUATION OF MEDICATIONS OR MEDICAL SUPPLIES REFLECTED IN TODAY'S VISIT SUMMARY  MAY NOT HAVE COMPLETED AS A CHANGE IN YOUR PLAN OF CARE. THESE CHANGES MAY HAVE ONLY BEEN DONE SO IN ORDER TO CLEAN UP LIST FROM DUPLICATIONS OR MISCELLANEOUS SUPPLIES ONLY NEEDED PERIODIC REORDERS. DO NOT DISCONTINUE MEDICATIONS LISTED UNLESS SPECIFICALLY DISCUSSED IN YOUR APPOINTMENT WITH PROVIDER OR SPECIALIST, IF YOU HAVE AN QUESTIONS, PLEASE CONTACT YOUR PROVIDER FOR CLARIFICATION IF NOT ADDRESSED IN YOUR PLAN OF CARE.     1. Dizziness  -     meclizine (ANTIVERT) 25 MG tablet; Take 1 tablet by mouth 3 times daily as needed for  Dizziness, Disp-60 tablet, R-5Normal  2. Essential hypertension  -     cloNIDine (CATAPRES) 0.1 MG tablet; Take 1 tablet by mouth 2 times daily, Disp-180 tablet, R-0Normal    Return in about 2 weeks (around 12/11/2020) for Blood pressure check/ hypertension.    COMMUNICATION:       On this date 11/27/2020 I have spent 30 minutes reviewing previous notes, test results and face to face with the patient discussing the diagnosis and importance of compliance with  the treatment plan as well as documenting on the day of the visit.      ???The best way to find yourself is to lose yourself in the service of others??? - [employer]. Colleene Swarthout APRN-CNP  [facility] [place]   [email or web address]  Office: [phone]     An electronic signature was used to authenticate this note.  Signed by Mayo Ao, APRN - CNP, APRN-CNP on 11/27/2020 at 1:38 PM

## 2020-11-27 NOTE — Patient Instructions (Addendum)
Hold clonidine until blood pressures are elevated.   STOP TAKING THE CARVEDILOL - HOLD CLONIDINE     RETURN TWO WEEKS.     START MECLIZINE FOR DIZZINESS      All medication stopped by cardiology -    atenoloL (TENORMIN) 25 mg tablet    oxyCODONE-acetaminophen (PERCOCET) 5-325 mg per tablet    potassium chloride (MICRO-K) 10 MEQ CR capsule    furosemide (LASIX) 80 mg tablet    prazosin (MINIPRESS) 1 mg capsule    meclizine (ANTIVERT) 25 mg tablet I HAVE RESTARTED THIS DUE TO DIZZINESS   carvediloL (COREG) 12.5 mg tablet     BRING YOUR PILLS IN WITH YOU WHEN YOU COME BACK IN.     INCREASE YOU WATER INTAKE FOR NOW.

## 2020-12-11 ENCOUNTER — Encounter: Payer: MEDICARE | Attending: Family | Primary: Family

## 2020-12-19 ENCOUNTER — Ambulatory Visit: Admit: 2020-12-19 | Discharge: 2020-12-19 | Payer: MEDICARE | Attending: Physician Assistant | Primary: Family

## 2020-12-19 DIAGNOSIS — R1032 Left lower quadrant pain: Secondary | ICD-10-CM
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
## 2021-01-01 ENCOUNTER — Telehealth

## 2021-01-01 NOTE — Telephone Encounter (Signed)
Our records indicate that your patient is coming due for their recommended 3 month follow up testing from  lung cancer screening.     For your convenience, we have pended the order for the scan for you. If you do not agree with the need for the test, please cancel the order and let us know.     Sincerely,    [facility]   Lung Cancer Screening Program

## 2021-01-11 ENCOUNTER — Inpatient Hospital Stay: Admit: 2021-01-11 | Payer: MEDICARE | Primary: Family

## 2021-01-11 DIAGNOSIS — R918 Other nonspecific abnormal finding of lung field: Secondary | ICD-10-CM

## 2021-02-05 NOTE — Telephone Encounter (Signed)
LOV 11/27/20   Last refill 11/13/20     Current med 300mg  gabapentin bid  pt has 4 caps remaining     Pt asking for increase of gabapentin. Pt states his feet are hurting like crazy and wanted a 30 day supply to [facility] in [place] and a 90 day supply to the .     Pt states he's taken gabapentin for years but feels like he may becoming numb to them.    Pt aware MD is out of office until the 7th.     Medication pending for 30 days please adjust if acceptable     Next Visit Date:  No future appointments.    Health Maintenance   Topic Date Due    Diabetic foot exam  Never done    Diabetic microalbuminuria test  Never done    Diabetic retinal exam  Never done    DTaP/Tdap/Td vaccine (1 - Tdap) Never done    Shingles vaccine (1 of 2) Never done    Flu vaccine (1) 11/05/2020    COVID-19 Vaccine (3 - Booster for Pfizer series) 03/14/2021 (Originally 09/27/2019)    Hepatitis C screen  09/12/2021 (Originally 12/13/1963)    Colorectal Cancer Screen  07/07/2021    A1C test (Diabetic or Prediabetic)  09/12/2021    Lipids  09/20/2021    Low dose CT lung screening  09/20/2021    Depression Screen  11/27/2021    Pneumococcal 65+ years Vaccine  Completed    AAA screen  Completed    Hepatitis A vaccine  Aged Out    Hib vaccine  Aged Out    Meningococcal (ACWY) vaccine  Aged Out       Hemoglobin A1C (%)   Date Value   09/12/2020 4.9   12/14/2019 6.6 (H)             ( goal A1C is < 7)   No results found for: LABMICR  LDL Cholesterol (mg/dL)   Date Value   02/13/2020 34       (goal LDL is <100)   AST (U/L)   Date Value   11/26/2020 15     ALT (U/L)   Date Value   11/26/2020 17     BUN (mg/dL)   Date Value   11/28/2020 12     BP Readings from Last 3 Encounters:   12/19/20 (!) 142/86   11/27/20 102/62   11/26/20 (!) 168/88          (goal 120/80)    All Future Testing planned in [facility]  Lab Frequency Next Occurrence   PET CT AXUMIN SCAN Once 09/27/2020               Patient Active Problem List:     Essential hypertension     Hypertension      Chronic obstructive pulmonary disease, unspecified COPD type (HCC)     Hyperlipidemia     Type 2 diabetes mellitus     H/O gastrostomy     Open abdominal wall wound, sequela

## 2021-02-05 NOTE — Telephone Encounter (Signed)
LOV 11/27/20   Last refill 11/13/20     Current med 300mg  gabapentin bid  pt has 4 caps remaining     Pt asking for increase of gabapentin. Pt states his feet are hurting like crazy and wanted a 30 day supply to [facility] in [place] and a 90 day supply to the .     Pt states he's taken gabapentin for years but feels like he may becoming numb to them.    Pt aware MD is out of office until the 7th.     Medication pending for 90 days please adjust if acceptable     Next Visit Date:  No future appointments.    Health Maintenance   Topic Date Due    Diabetic foot exam  Never done    Diabetic microalbuminuria test  Never done    Diabetic retinal exam  Never done    DTaP/Tdap/Td vaccine (1 - Tdap) Never done    Shingles vaccine (1 of 2) Never done    Flu vaccine (1) 11/05/2020    COVID-19 Vaccine (3 - Booster for Pfizer series) 03/14/2021 (Originally 09/27/2019)    Hepatitis C screen  09/12/2021 (Originally 12/13/1963)    Colorectal Cancer Screen  07/07/2021    A1C test (Diabetic or Prediabetic)  09/12/2021    Lipids  09/20/2021    Low dose CT lung screening  09/20/2021    Depression Screen  11/27/2021    Pneumococcal 65+ years Vaccine  Completed    AAA screen  Completed    Hepatitis A vaccine  Aged Out    Hib vaccine  Aged Out    Meningococcal (ACWY) vaccine  Aged Out       Hemoglobin A1C (%)   Date Value   09/12/2020 4.9   12/14/2019 6.6 (H)             ( goal A1C is < 7)   No results found for: LABMICR  LDL Cholesterol (mg/dL)   Date Value   02/13/2020 34       (goal LDL is <100)   AST (U/L)   Date Value   11/26/2020 15     ALT (U/L)   Date Value   11/26/2020 17     BUN (mg/dL)   Date Value   11/28/2020 12     BP Readings from Last 3 Encounters:   12/19/20 (!) 142/86   11/27/20 102/62   11/26/20 (!) 168/88          (goal 120/80)    All Future Testing planned in [facility]  Lab Frequency Next Occurrence   PET CT AXUMIN SCAN Once 09/27/2020               Patient Active Problem List:     Essential hypertension     Hypertension      Chronic obstructive pulmonary disease, unspecified COPD type (HCC)     Hyperlipidemia     Type 2 diabetes mellitus     H/O gastrostomy     Open abdominal wall wound, sequela

## 2021-02-06 MED ORDER — GABAPENTIN 300 MG PO CAPS
300 MG | ORAL_CAPSULE | Freq: Three times a day (TID) | ORAL | 1 refills | Status: AC
Start: 2021-02-06 — End: 2021-05-07

## 2021-02-06 MED ORDER — GABAPENTIN 300 MG PO CAPS
300 MG | ORAL_CAPSULE | Freq: Three times a day (TID) | ORAL | 0 refills | Status: AC
Start: 2021-02-06 — End: 2021-08-05

## 2021-02-06 NOTE — Telephone Encounter (Signed)
VM full will try call again later

## 2021-02-06 NOTE — Telephone Encounter (Signed)
Spoke with the patient and he was notified.

## 2021-05-21 IMAGING — MR MRI BRAIN WO CONTRAST
11 series · 42 of 48 positions shown · non-contrast
Comparison: Carotid Doppler ultrasound study from the same day.

HISTORY: Trigeminal neuralgia.
TECHNIQUE: Multiplanar, multisequential MRI images of the brain are obtained without intravenous contrast. Thin-section brainstem images are obtained.

[Series 2: t1_mprage_axial · axial · 1.0mm · 1.00mm/px · z∈[-55,+104]mm · 9 of 160 slices shown]
[im 1/160]
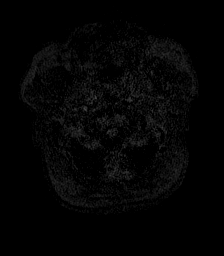
[im 20/160]
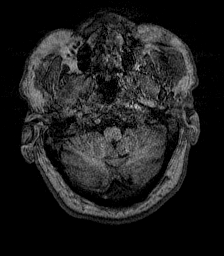
[im 40/160]
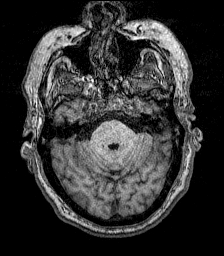
[im 60/160]
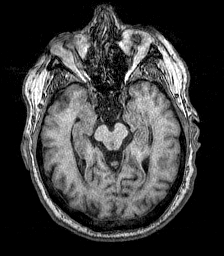
[im 80/160]
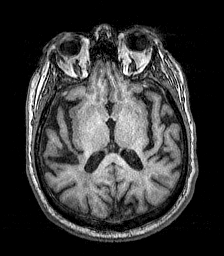
[im 100/160]
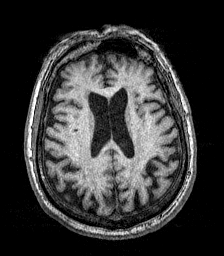
[im 120/160]
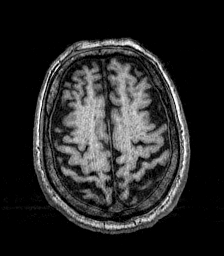
[im 140/160]
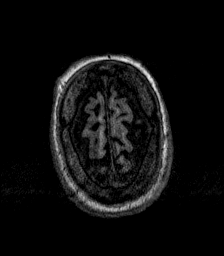
[im 160/160]
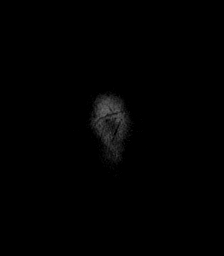

[Series 3: flair_axial_fs · axial · 5.0mm · 0.45mm/px · z∈[-57,+100]mm · 2 of 26 slices shown]
[im 1/26]
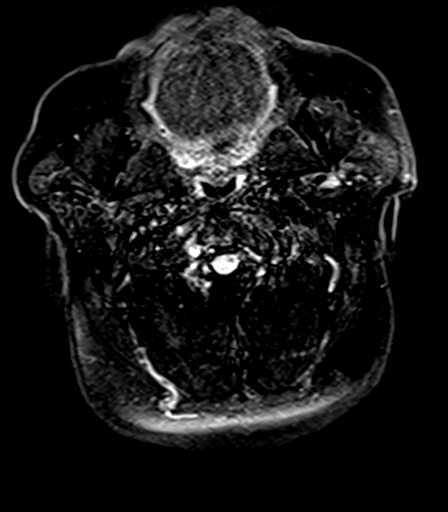
[im 26/26]
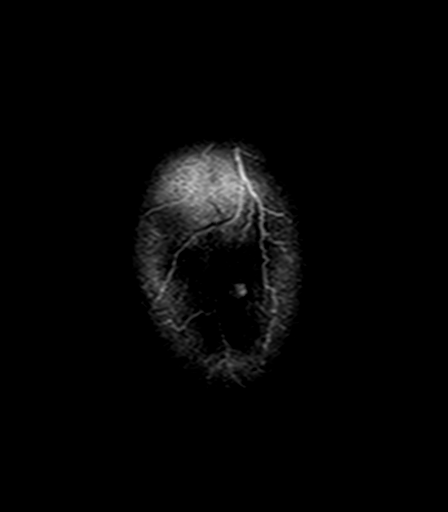

[Series 4: t2_axial · axial · 5.0mm · 0.72mm/px · z∈[-57,+100]mm · 2 of 26 slices shown]
[im 1/26]
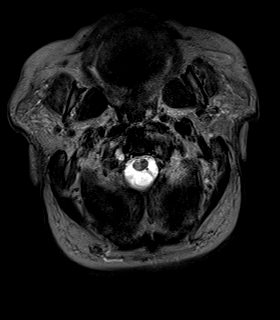
[im 26/26]
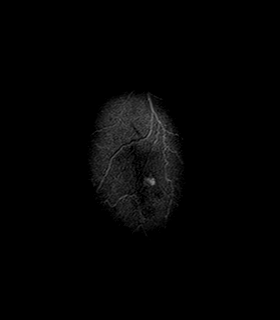

[Series 5: t1_mprage_cor_reformat · coronal · 1.5mm · 1.00mm/px · 8 of 180 slices shown]
[im 1/180]
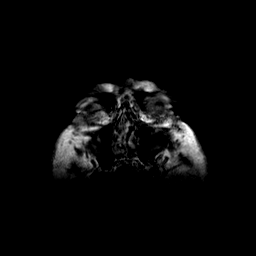
[im 36/180]
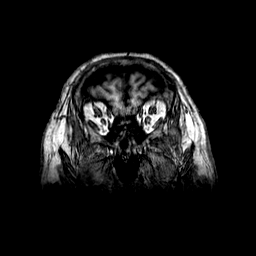
[im 54/180]
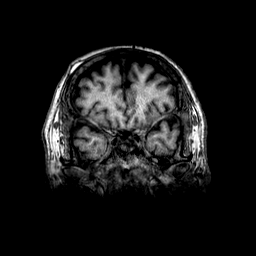
[im 72/180]
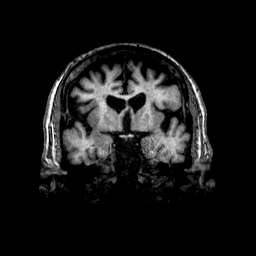
[im 108/180]
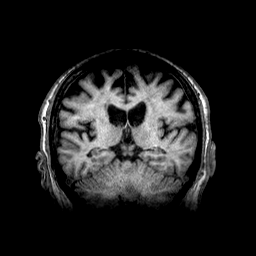
[im 126/180]
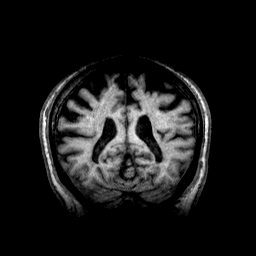
[im 144/180]
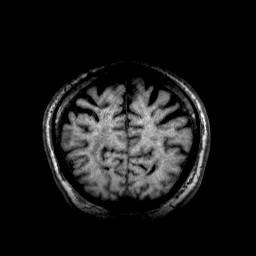
[im 180/180]
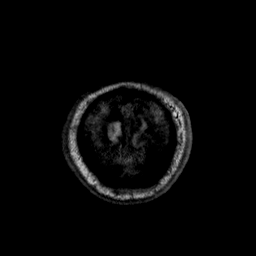

[Series 6: t1_mprage_sag_reformat · sagittal · 1.5mm · 1.10mm/px · 8 of 163 slices shown]
[im 1/163]
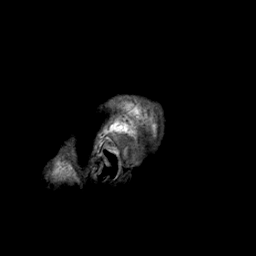
[im 19/163]
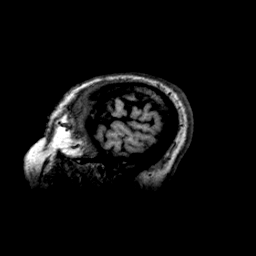
[im 55/163]
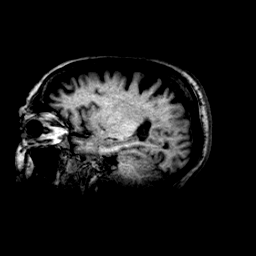
[im 73/163]
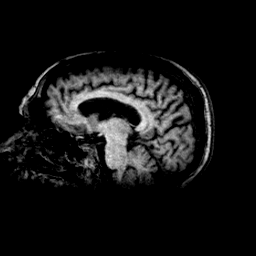
[im 91/163]
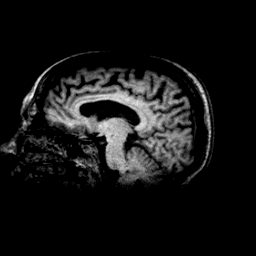
[im 109/163]
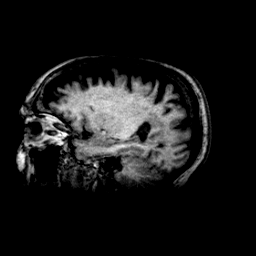
[im 145/163]
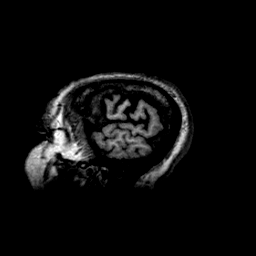
[im 163/163]
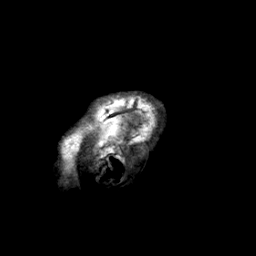

[Series 7: DWI · axial · 5.0mm · 1.26mm/px · z∈[-53,+97]mm · 2 of 25 slices shown (1 of 2)]
[im 1/25]
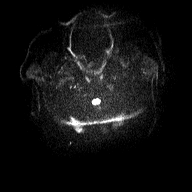
[im 25/25]
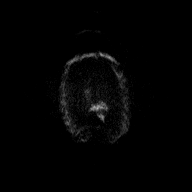

[Series 8: DWI · axial · 5.0mm · 1.26mm/px · z∈[-53,+97]mm · 2 of 25 slices shown (2 of 2)]
[im 1/25]
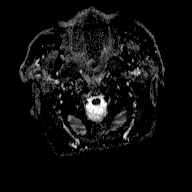
[im 25/25]
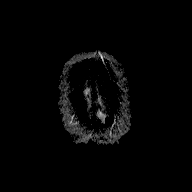

[Series 9: flash_axial · axial · 5.0mm · 0.45mm/px · z∈[-57,+100]mm · 2 of 26 slices shown]
[im 1/26]
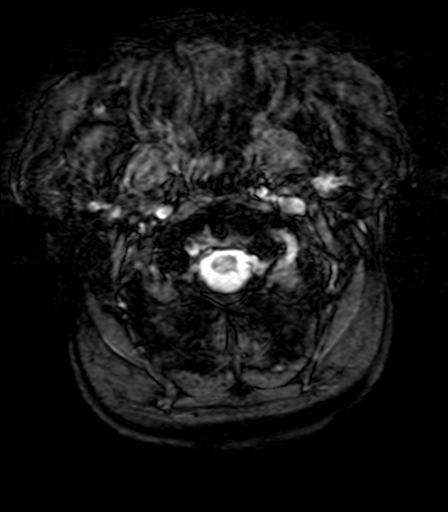
[im 26/26]
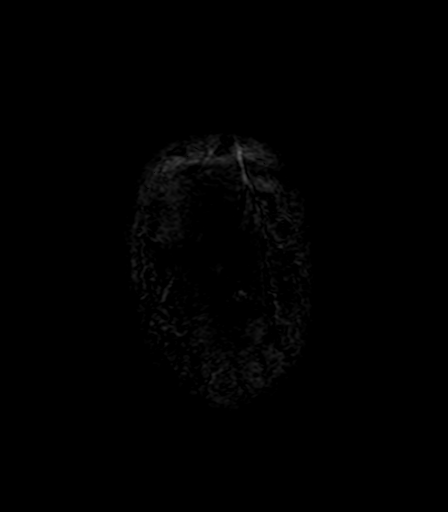

[Series 10: t2_axial_fs · axial · 0.8mm · 0.41mm/px · z∈[-41,-10]mm · 2 of 40 slices shown]
[im 1/40]
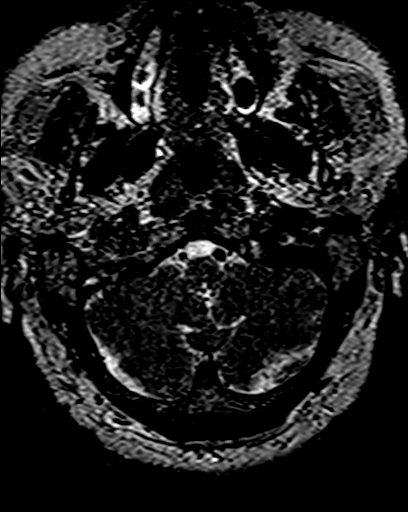
[im 40/40]
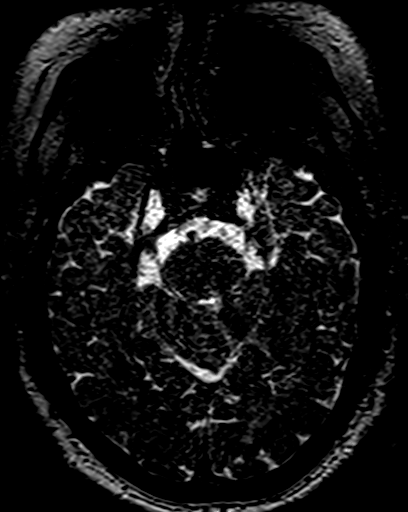

[Series 11: t2_cor_reformat_fs · coronal · 1.5mm · 0.33mm/px · 4 of 69 slices shown]
[im 1/69]
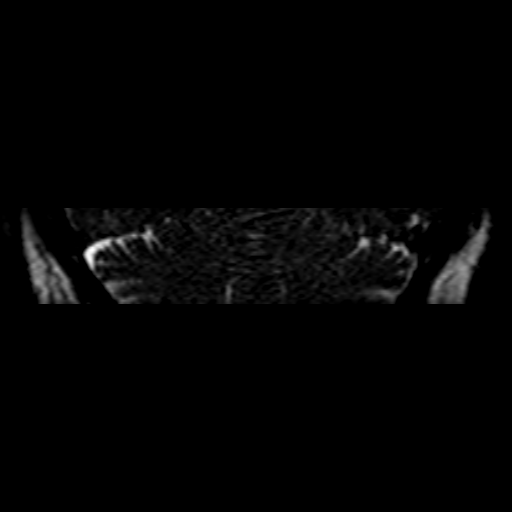
[im 23/69]
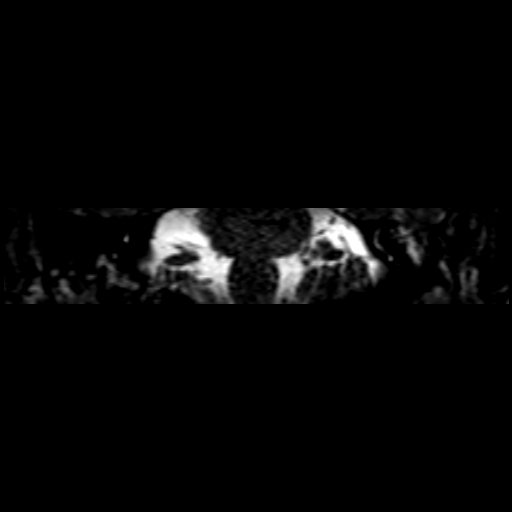
[im 46/69]
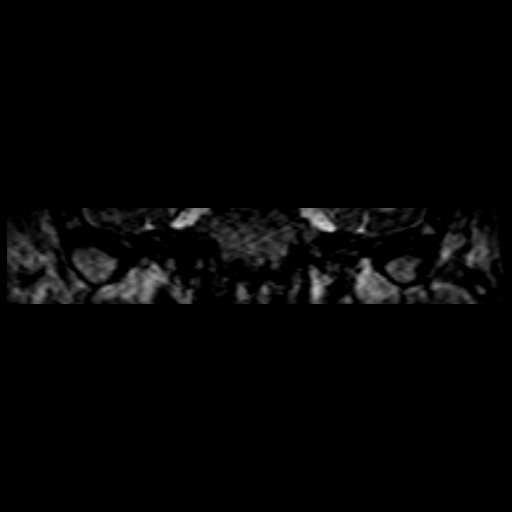
[im 69/69]
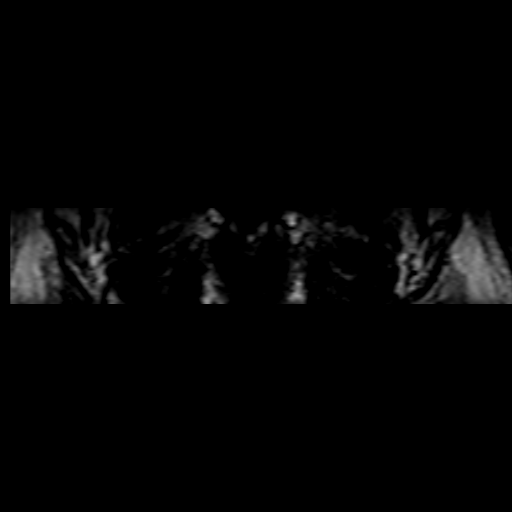

[Series 12: t1_vibe_axial_fs · axial · 1.0mm · 0.39mm/px · 1 of 40 slices shown]
[im 1/40]
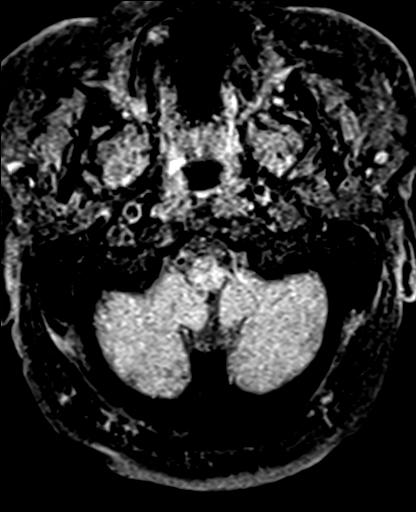

[42 of 48 positions shown; findings below may reference images not displayed]

FINDINGS: Motion artifact is seen on some images.

There is prominence of ventricles and cortical sulci. Periventricular, subcortical and pontine FLAIR and T2-hyperintensity areas are seen. Partially empty sella is seen. Midline structures are otherwise normal. There is no mass lesion or midline shift. No extra-axial fluid collection is seen. No area of restricted diffusion is seen. Focus of hemosiderin deposit involving left parietal lobe seen on image [DATE]. Linear hemosiderin deposit involving left parietal lobe seen on image [DATE].

Fluid-opacified mastoid air cells on the left with suspected fluid of left middle ear is seen. Mild mucosal thickening of ethmoid air cells are seen. Mucous retention cyst or polyp of left sphenoid sinus is seen. Right frontal sinus is not pneumatized. Orbits are normal. The rest of the paranasal sinuses, right-sided mastoid air cells and skull marrow signal are normal.

Visualized cisternal portions of trigeminal nerves are normal bilaterally. Meckel's caves and gasserian ganglia are normal. No obvious mass lesion at skull base identified.
IMPRESSION: Mild cerebral atrophy or involutional changes with mild chronic small vessel ischemic changes seen.

Focus of hemosiderin deposit involving left parietal lobe, possibly due to cavernous hemangioma, less likely focal chronic hemorrhagic lacunar infarct seen.

There may be linear hemosiderin deposit of left parietal lobe involving superficial portion, possibly due to previous hemorrhage.

No obvious pathology of trigeminal nerves, gasserian ganglia or Meckel's caves is seen.

Chronic paranasal sinusitis is seen.

There may be left-sided otomastoiditis.

No other acute disease.

## 2021-05-21 IMAGING — US DOP CAROTID BILATERAL
1 series · 13 of 24 positions shown · non-contrast
Comparison: None

HISTORY: 75-year-old Male with trigeminal neuralgia, syncope
TECHNIQUE: Gray-scale color Doppler and spectral Doppler imaging of the bilateral carotid and vertebral arteries is performed.

[Series 1: dop carotid bilateral · 13 of 56 slices shown]
[im 1/56]
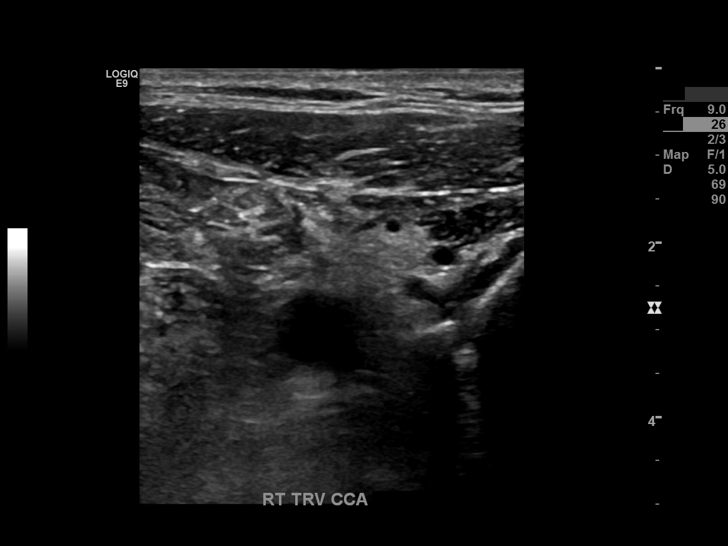
[im 5/56]
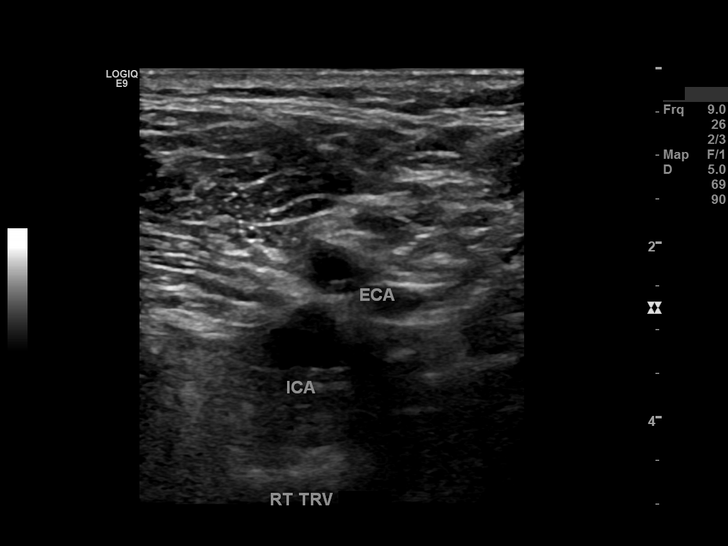
[im 10/56]
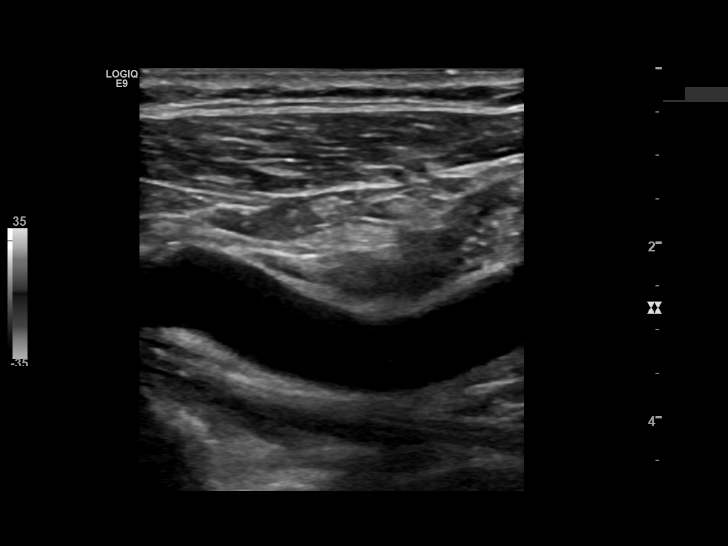
[im 15/56]
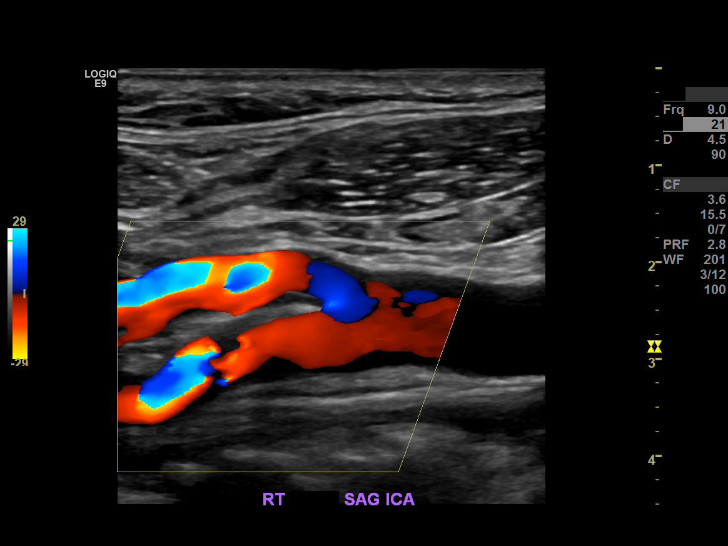
[im 20/56]
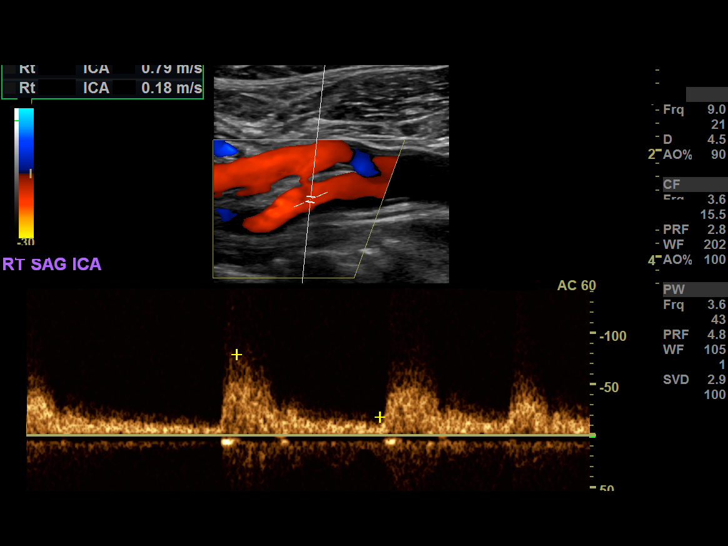
[im 24/56]
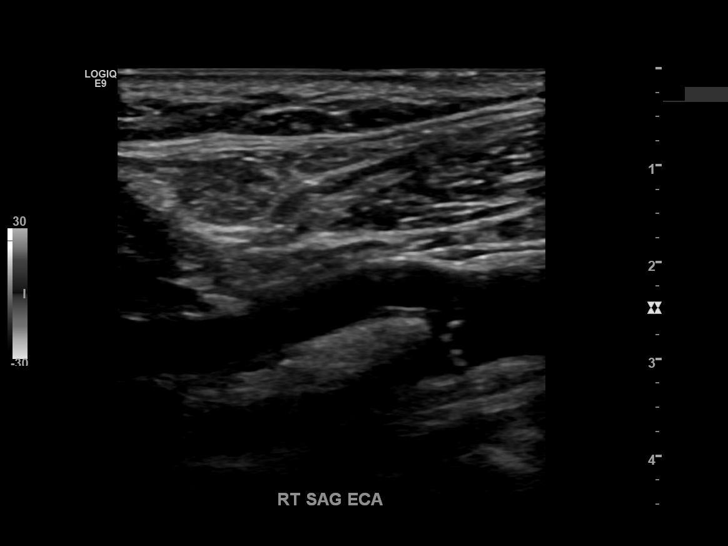
[im 29/56]
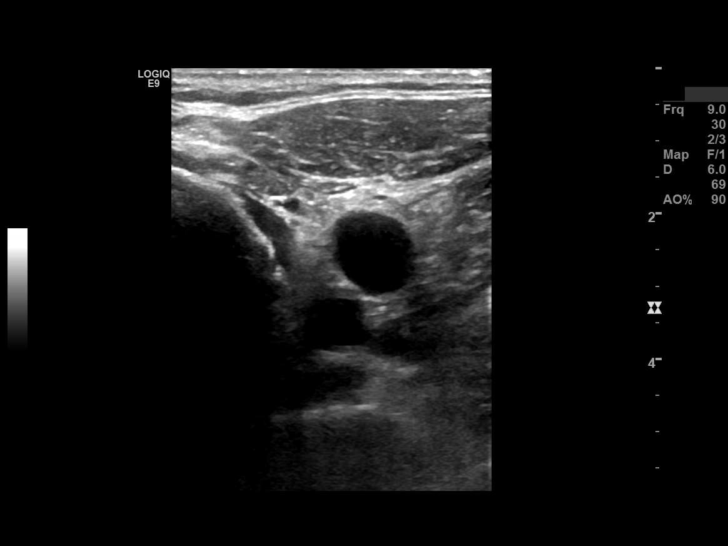
[im 32/56]
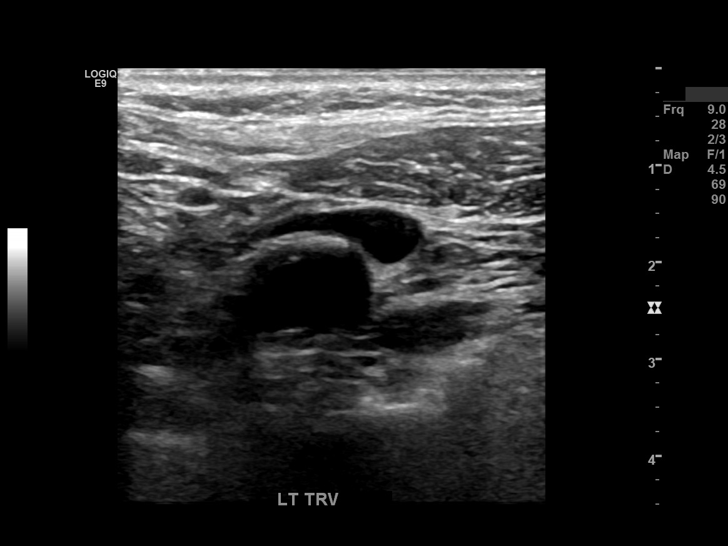
[im 36/56]
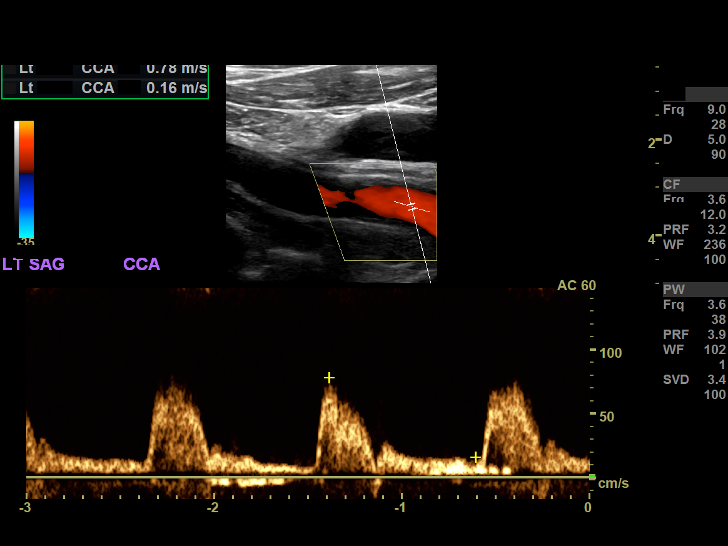
[im 41/56]
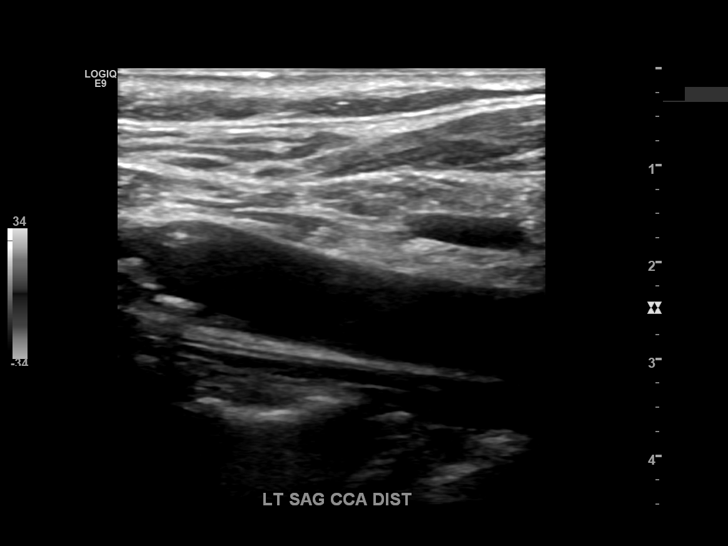
[im 46/56]
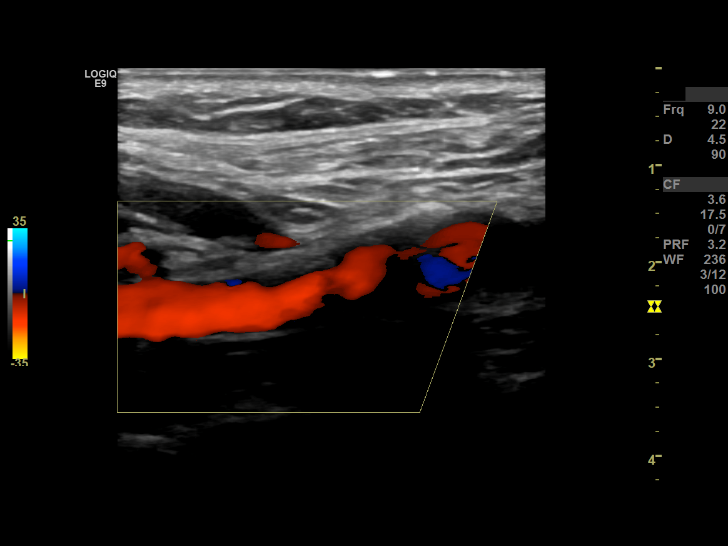
[im 51/56]
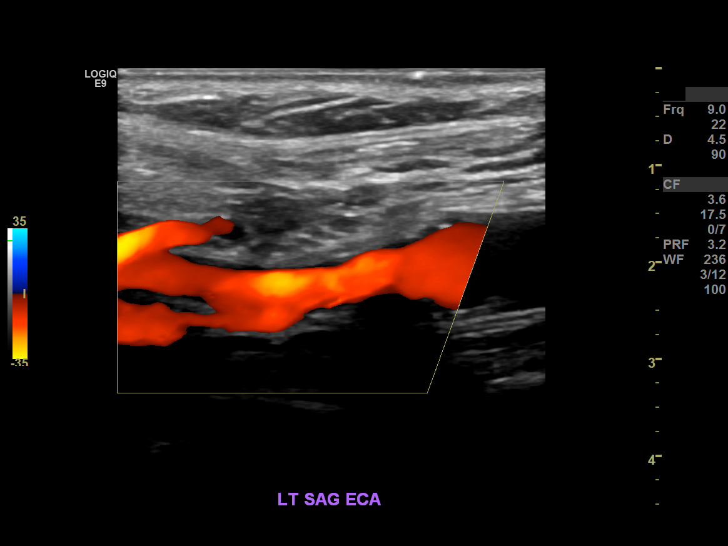
[im 56/56]
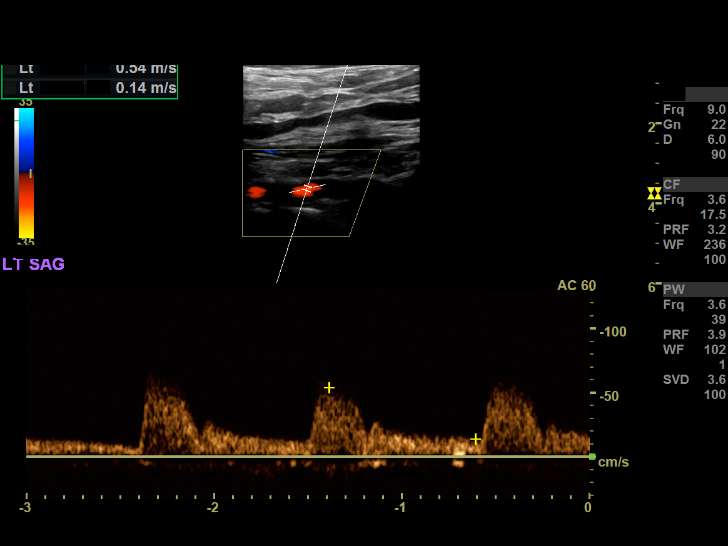

[13 of 24 positions shown; findings below may reference images not displayed]

FINDINGS: Gray-scale soft/calcified carotid plaques:  There are moderate atherosclerotic plaques identified in both carotid arteries. 

CARDIAC RHYTHM: IRREGULAR

RIGHT CAROTID ARTERY:

ICA peak systolic velocity: 0.90 m/s

CCA peak systolic velocity: 1.03 m/s

ICA/CCA Ratio:

ICA end-diastolic velocity: 0.27 m/s

ECA velocity:  1.64 m/s

Vertebral artery peak systolic velocity: 0.52 m/s

Vertebral artery direction: ANTEGRADE

LEFT CAROTID ARTERY:

ICA peak systolic velocity: 1.17 m/s

CCA peak systolic velocity: 0.89 m/s

ICA/CCA Ratio:

ICA end-diastolic velocity: 0.27 m/s

ECA velocity:  1.27 m/s

Vertebral artery peak systolic velocity: 0.54 m/s

Vertebral artery direction: ANTEGRADE
IMPRESSION: 1.  CARDIAC RHYTHM: IRREGULAR

2.  Gray-scale soft/calcified carotid plaques:  There are moderate atherosclerotic plaques identified in both carotid arteries. 

3.  No hemodynamically significant stenosis of the carotid arteries.

Stenosis velocity criteria are extrapolated from diameter data as defined by the Society of [employer] in Ultrasound Consensus Conference Radiology 7009; 229; 304-346

## 2021-08-01 IMAGING — US DOP ARTERIAL LWR EXT BIL
1 series · 13 of 16 positions shown · non-contrast
Comparison: None.

HISTORY: 75-year-old male with peripheral vascular disease unspecified.
TECHNIQUE: Multiple images from a real-time, duplex and color Doppler ultrasound of the arterial system of right and left leg are performed. Velocities given below are peak systolic.

[Series 1: dop arterial lwr ext bil · arterial · 13 of 69 slices shown]
[im 1/69]
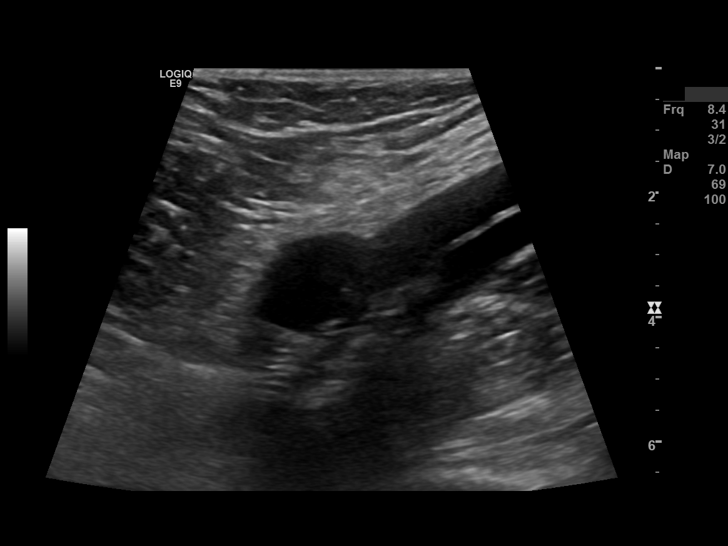
[im 5/69]
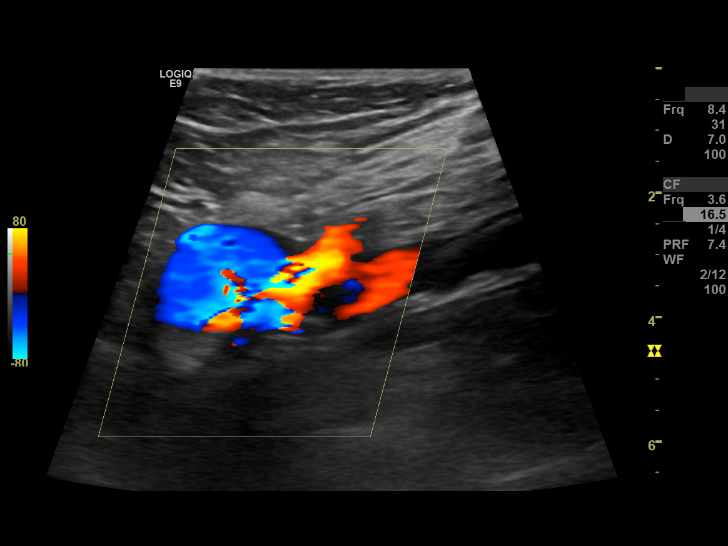
[im 14/69]
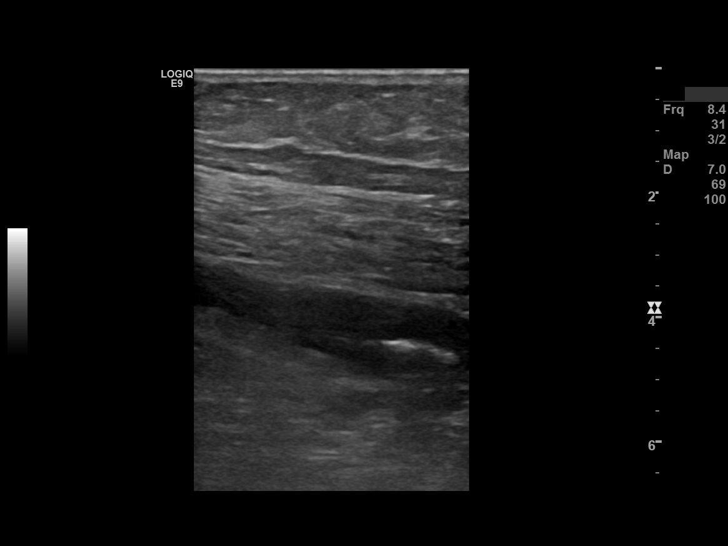
[im 19/69]
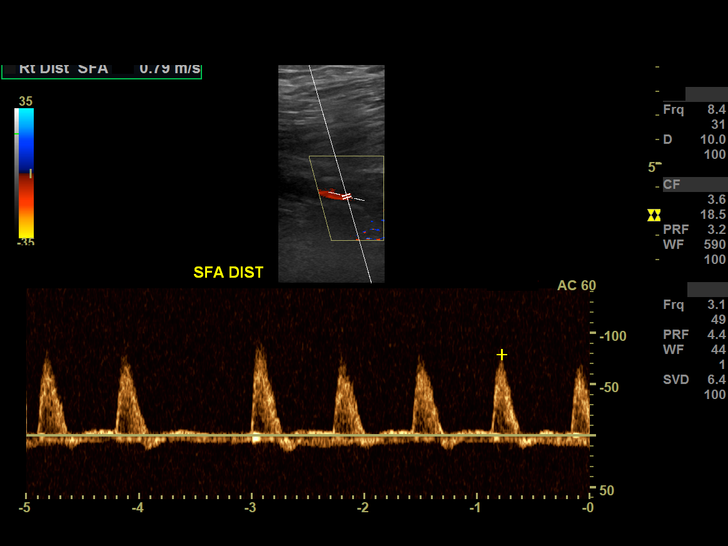
[im 23/69]
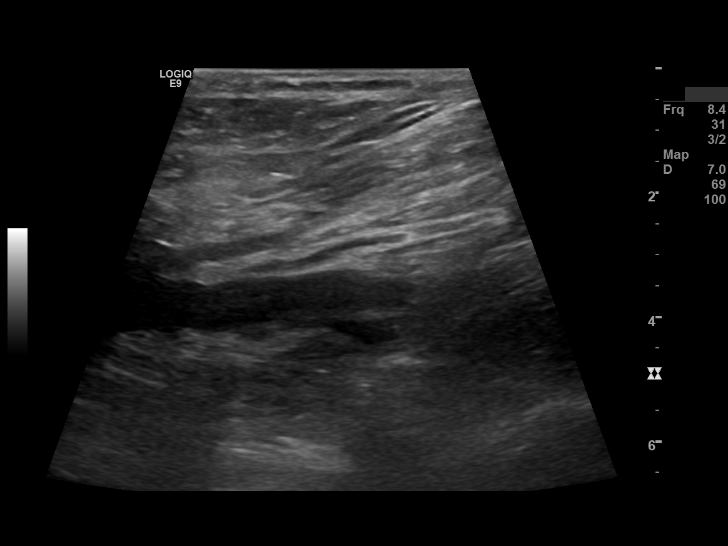
[im 28/69]
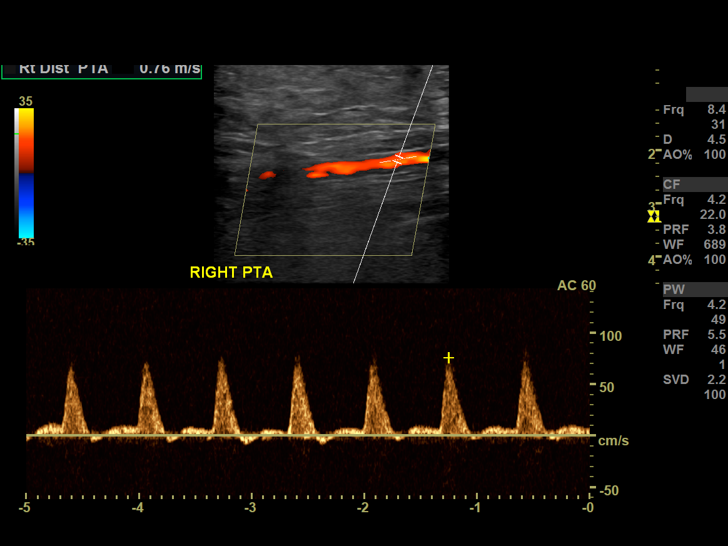
[im 37/69]
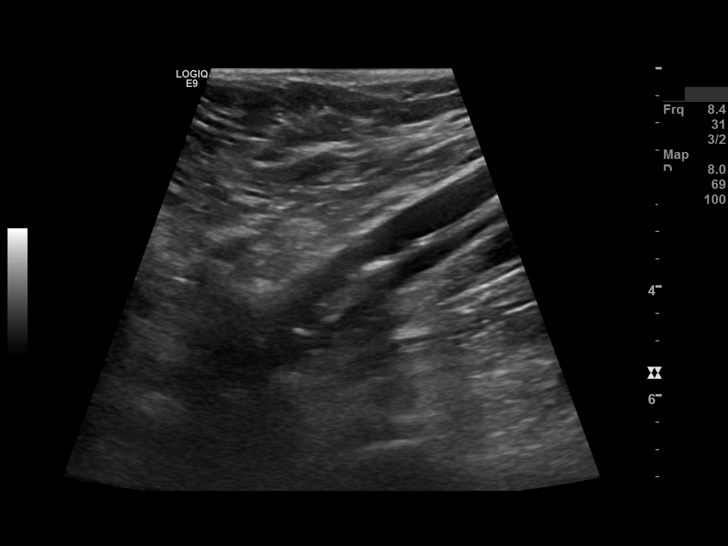
[im 41/69]
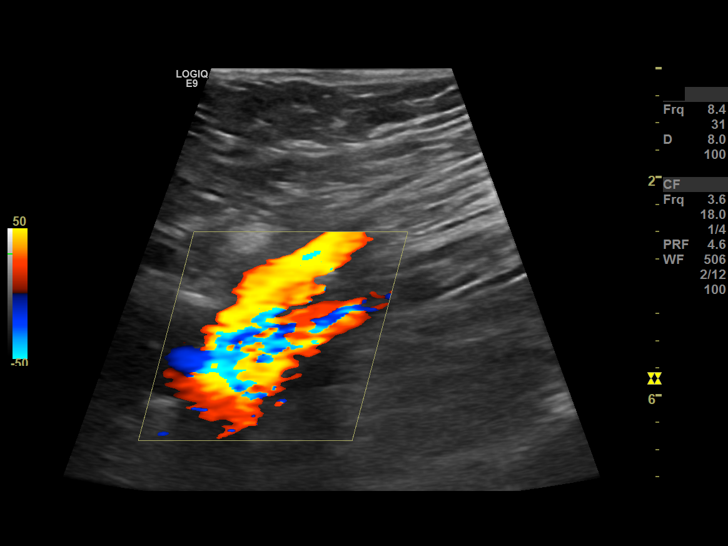
[im 46/69]
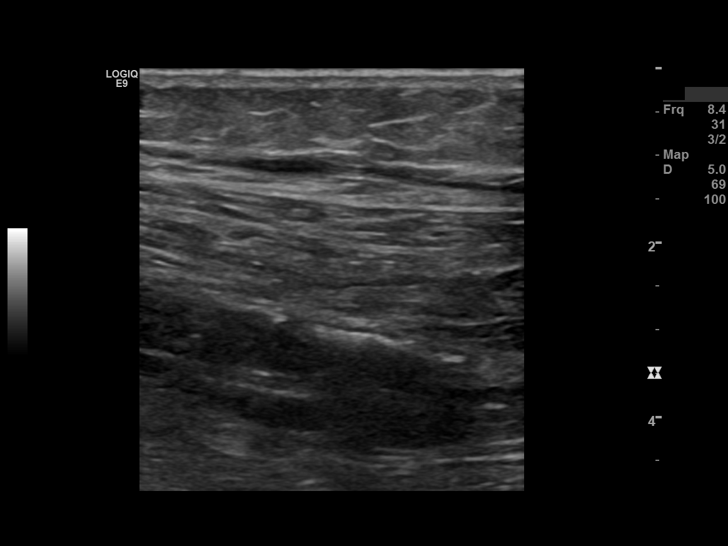
[im 50/69]
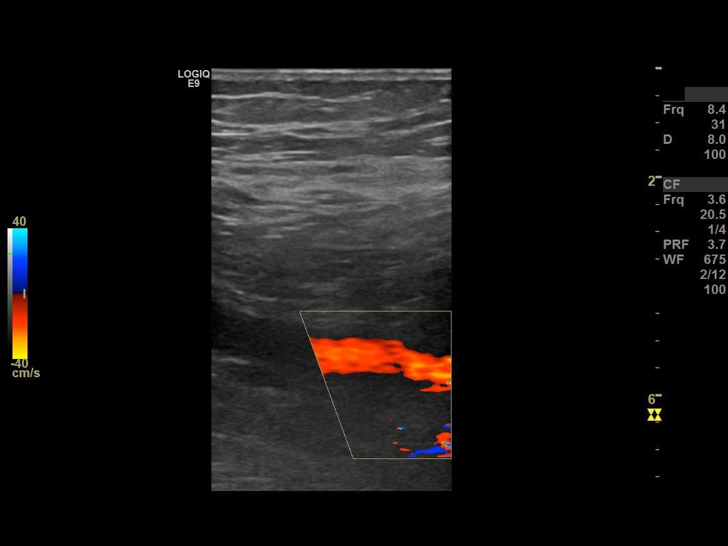
[im 55/69]
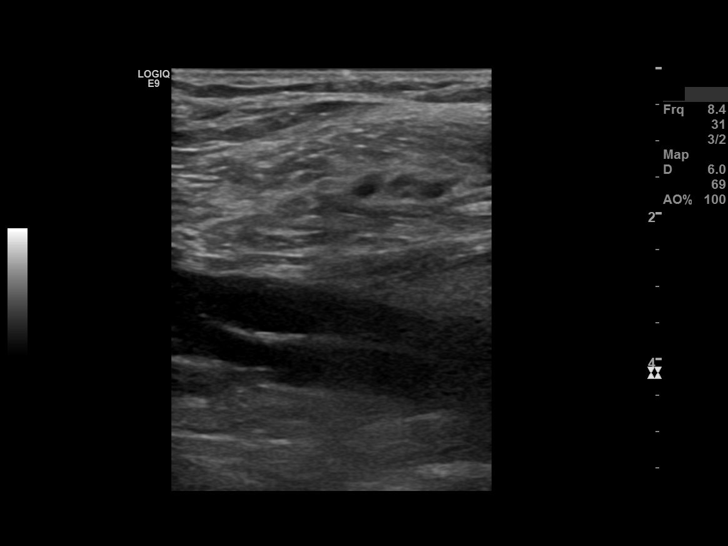
[im 64/69]
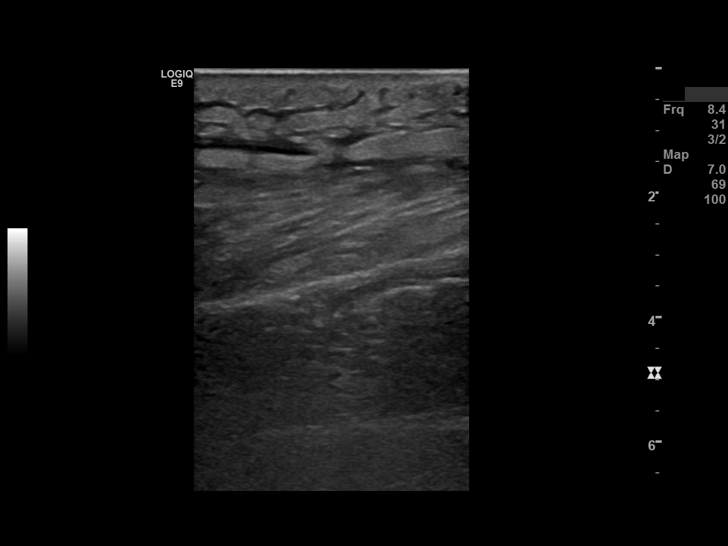
[im 69/69]
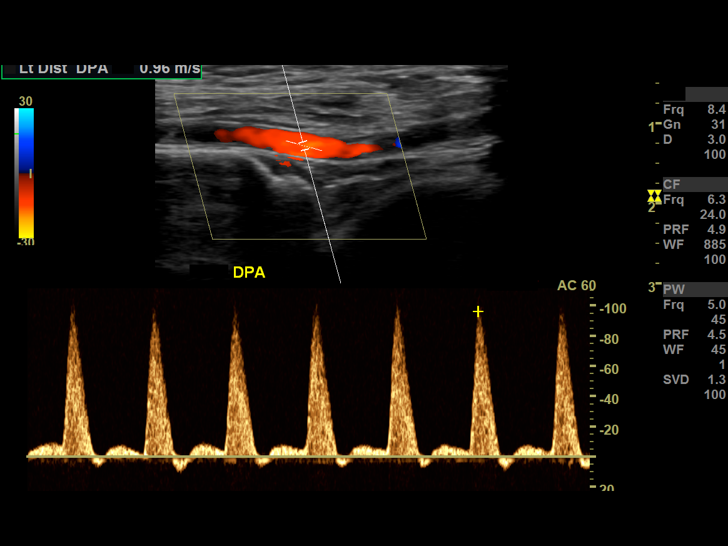

[13 of 16 positions shown; findings below may reference images not displayed]

FINDINGS: The right leg arterial system demonstrates the following waveforms and peak systolic velocities (m/s):

Common femoral artery: TRIPHASIC, 1.62 (m/s)

Profunda femoral artery: BIPHASIC, 1.59 (m/s)

Superficial femoral artery proximal: TRIPHASIC, 1.97 (m/s)

Superficial femoral artery mid: TRIPHASIC, 0.82 (m/s)

Superficial femoral artery distal: TRIPHASIC, 0.79 (m/s)

Popliteal artery proximal: TRIPHASIC, 0.82 (m/s)

Popliteal artery distal: TRIPHASIC, 0.82 (m/s)

Anterior tibial artery: TRIPHASIC,  0.95 (m/s)

Posterior tibial artery: TRIPHASIC, 0.76 (m/s)

Peroneal artery: TRIPHASIC, 0.97 (m/s)

Dorsalis pedis artery: TRIPHASIC, 1.00 (m/s)

The left leg arterial system demonstrates the following waveforms and peak systolic velocities (m/s):

Common femoral artery: TRIPHASIC, 1.39 (m/s)

Profunda femoral artery: TRIPHASIC, 1.35 (m/s)

Superficial femoral artery proximal: TRIPHASIC, 1.53 (m/s)

Superficial femoral artery mid: TRIPHASIC, 1.46 (m/s)

Superficial femoral artery distal: TRIPHASIC, 0.55 (m/s)

Popliteal artery proximal: TRIPHASIC, 0.65 (m/s)

Popliteal artery distal: TRIPHASIC,  0.70  (m/s)

Anterior tibial artery: TRIPHASIC. 0.85 (m/s)

Posterior tibial artery: TRIPHASIC, 1.01 (m/s)

Peroneal artery: TRIPHASIC, 0.80 (m/s)

Dorsalis pedis artery: TRIPHASIC, 0.96 (m/s)

Cardiac rhythm: IRREGULAR.
IMPRESSION: 1. There is no evidence of a hemodynamically significant stenosis in either lower extremity from the common femoral arteries to the distal trifurcation and dorsalis pedis arteries.

2. Atheromatous plaque with aneurysm of the right common femoral artery is seen measuring 24 x 25 mm.

## 2021-12-12 NOTE — Telephone Encounter (Signed)
Reviewed CT Lung Screening Reminder List and patient's chart, patient is due and not yet scheduled. Mailed letter to patient.

## 2022-05-07 IMAGING — MR MRI BRAIN WO CONTRAST
9 series · 48 of 48 positions shown · non-contrast
Comparison: 05/21/21

Images Obtained from [facility]
MRI brain without
INDICATION: Dizziness and giddiness.
TECHNIQUE: Routine scanning without IV contrast.

[Series 1: bSSFP · axial · 8.0mm · 1.17mm/px · 1 of 19 slices shown]
[im 1/19]
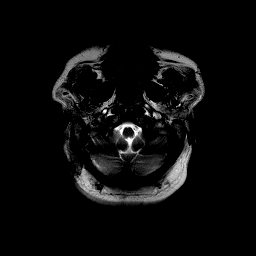

[Series 2: t1_mprage_axial · axial · 1.0mm · 1.00mm/px · z∈[-61,+114]mm · 11 of 176 slices shown]
[im 1/176]
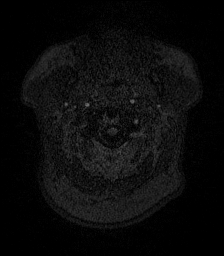
[im 18/176]
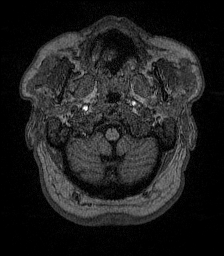
[im 36/176]
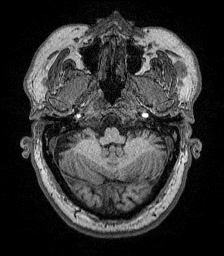
[im 53/176]
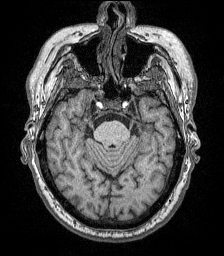
[im 71/176]
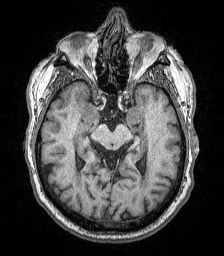
[im 88/176]
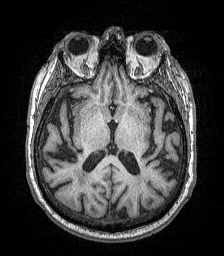
[im 106/176]
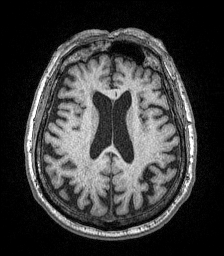
[im 123/176]
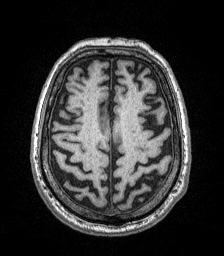
[im 141/176]
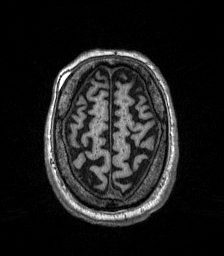
[im 158/176]
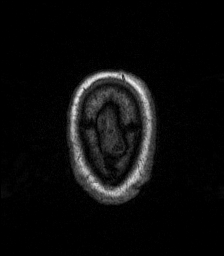
[im 176/176]
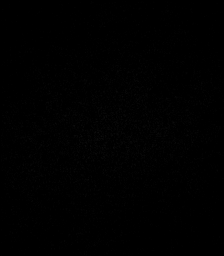

[Series 3: t1_mprage_cor_reformat · coronal · 1.5mm · 1.00mm/px · 12 of 189 slices shown]
[im 1/189]
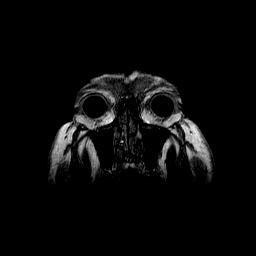
[im 18/189]
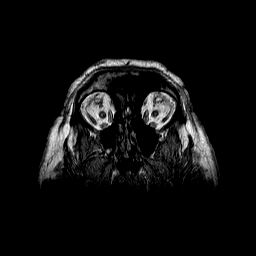
[im 35/189]
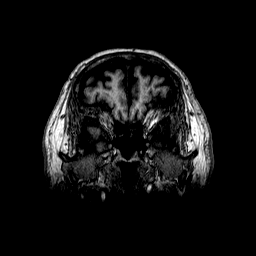
[im 52/189]
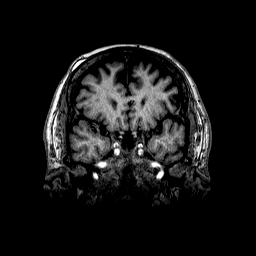
[im 69/189]
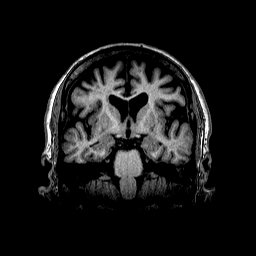
[im 86/189]
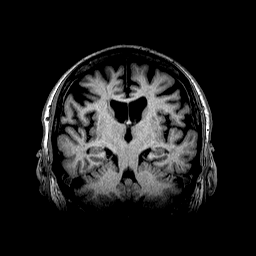
[im 103/189]
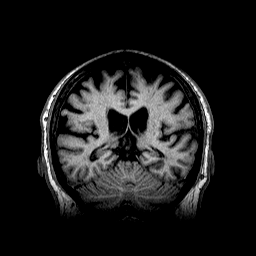
[im 120/189]
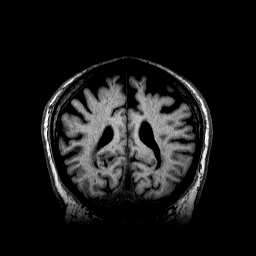
[im 137/189]
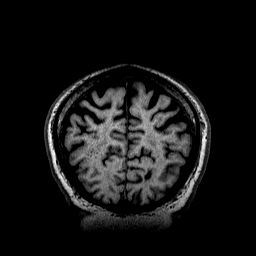
[im 154/189]
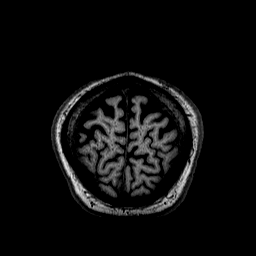
[im 171/189]
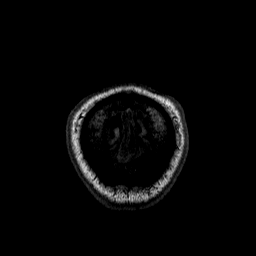
[im 189/189]
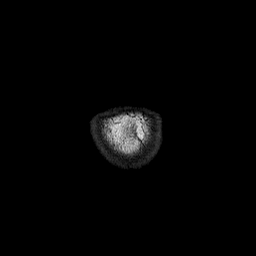

[Series 4: flair_axial_fs · axial · 5.0mm · 0.45mm/px · z∈[-57,+106]mm · 2 of 27 slices shown]
[im 1/27]
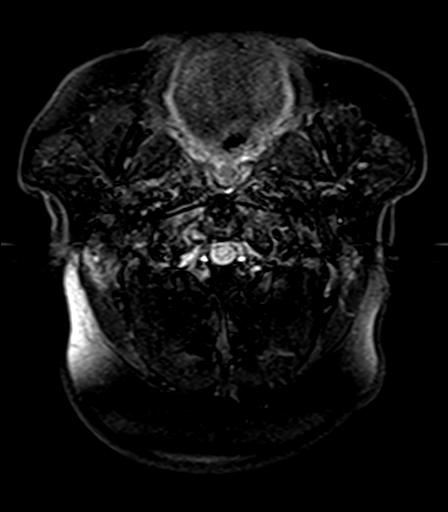
[im 27/27]
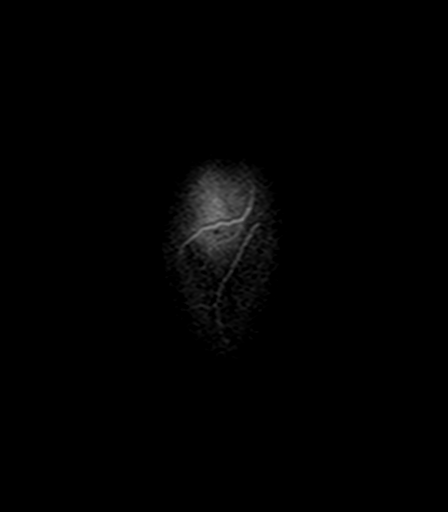

[Series 5: t1_mprage_sag_reformat · sagittal · 1.5mm · 1.00mm/px · 11 of 163 slices shown]
[im 1/163]
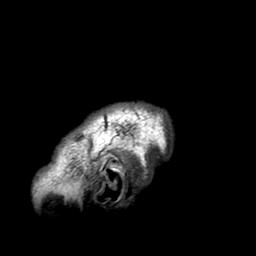
[im 17/163]
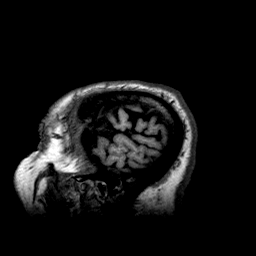
[im 33/163]
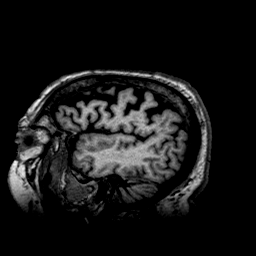
[im 49/163]
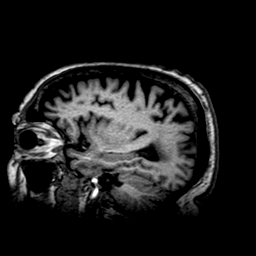
[im 65/163]
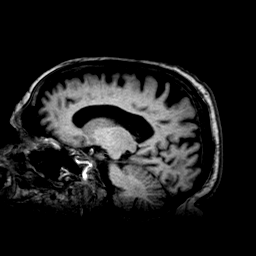
[im 82/163]
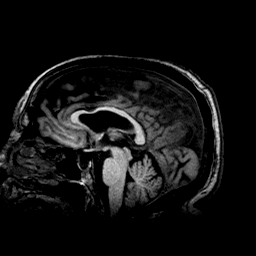
[im 98/163]
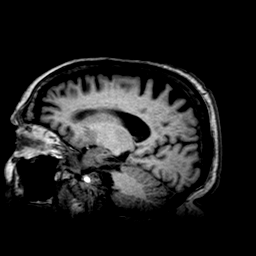
[im 114/163]
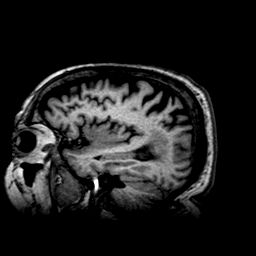
[im 130/163]
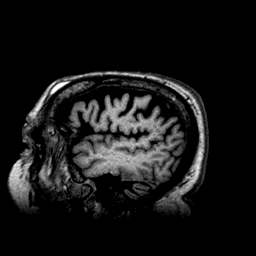
[im 146/163]
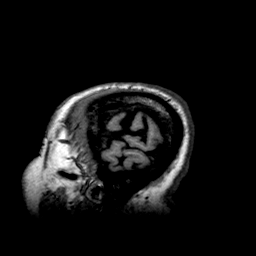
[im 163/163]
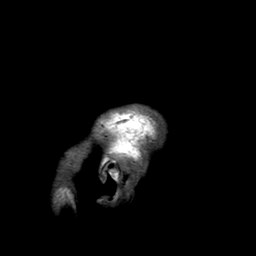

[Series 6: t2_axial · axial · 5.0mm · 0.72mm/px · z∈[-57,+106]mm · 2 of 27 slices shown]
[im 1/27]
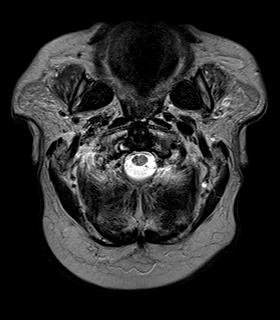
[im 27/27]
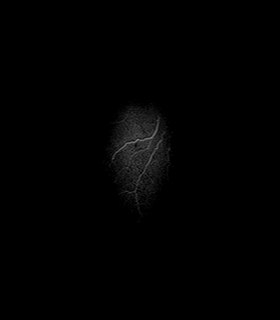

[Series 7: DWI · axial · 5.0mm · 1.26mm/px · z∈[-57,+106]mm · 5 of 81 slices shown (1 of 2)]
[im 1/81]
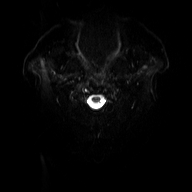
[im 21/81]
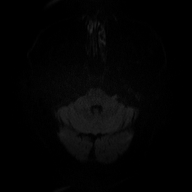
[im 41/81]
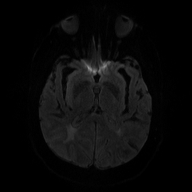
[im 61/81]
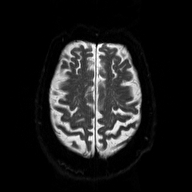
[im 81/81]
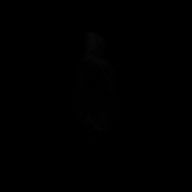

[Series 8: DWI · axial · 5.0mm · 1.26mm/px · z∈[-57,+106]mm · 2 of 27 slices shown (2 of 2)]
[im 1/27]
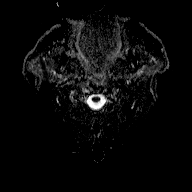
[im 27/27]
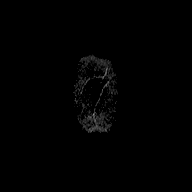

[Series 9: flash_axial · axial · 5.0mm · 0.45mm/px · z∈[-57,+106]mm · 2 of 27 slices shown]
[im 1/27]
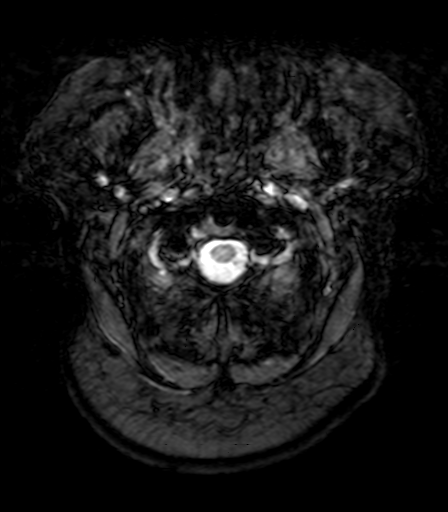
[im 27/27]
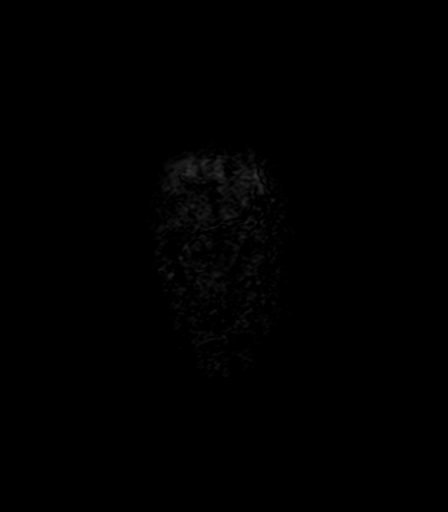

[48 of 48 positions shown; findings below may reference images not displayed]

FINDINGS: The bilateral periventricular and deep white matter ischemic changes look the same. No evidence of acute ischemia.
The ventricles are normal in size without shift. No abnormal signal areas are seen in the brain. Imaged portions of the paranasal sinuses look unremarkable. No abnormal signal on diffusion to suggest
acute ischemia. Left parieto-occipital area of methemoglobin looks similar to prior.. There are flow voids present in the basilar portion of the intracranial arteries.
IMPRESSION: Stable chronic findings since prior.

## 2022-06-10 IMAGING — CT CT TEMPORAL BONES WO/W CONT
4 of 9 series · 16 of 40 positions shown, 18 images · IV contrast (isovue)
Comparison: None.

Images Obtained from [facility]
REASON FOR EXAM: Sensorineural hearing loss, right ear, with restricted hearing on the contralateral side.
TECHNIQUE: CT temporal bone without and with intravenous contrast. Axial images were obtained with coronal and sagittal MPR reconstructions. Dose reduction technique used: Automated exposure control
and adjustment of the mA and/or kV according to patient size. Total radiation dose to patient is CTDIvol 40.80 mGy and DLP 408.00 mGy-cm.
. CT/Cardiac Nuclear Medicine exam count in previous 12-months: 0
CONTRAST: 100mL, Isovue 300 Serum creatinine: 1.5 mg/dL.

[Series 5: rt iac pre · axial · non-contrast · 0.23mm/px · z∈[-887,-834]mm · 5 of 133 slices shown]
[im 23/133  bone]
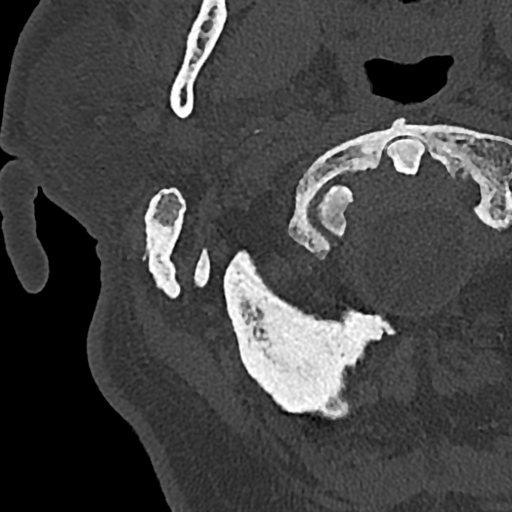
[im 45/133  bone]
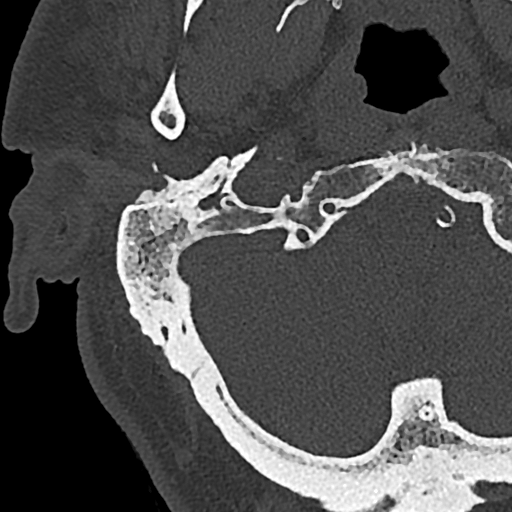
[im 67/133  bone]
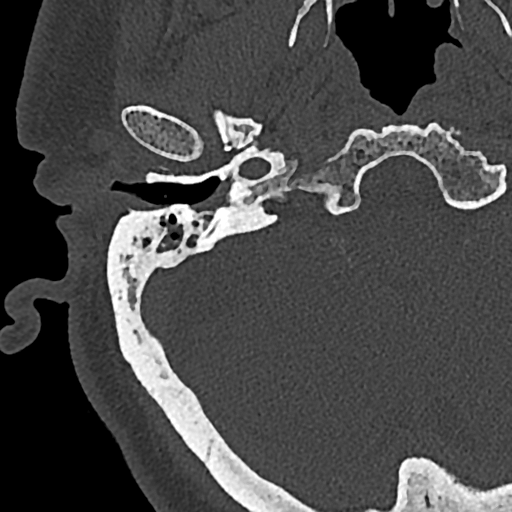
[im 89/133  bone]
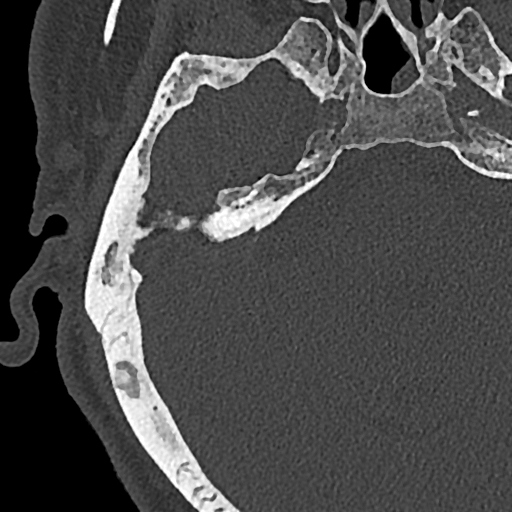
[im 111/133  bone]
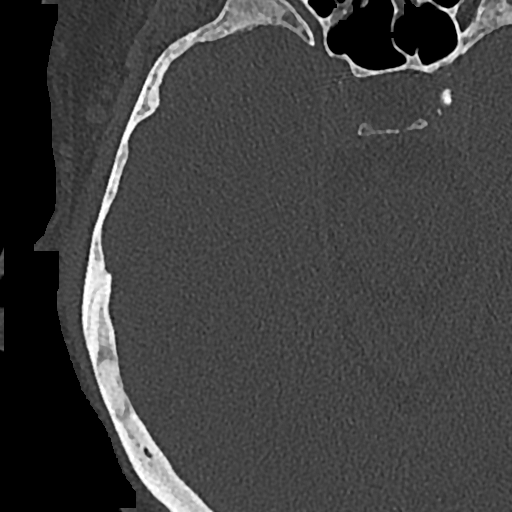

[Series 6: lt iac pre · axial · non-contrast · 0.23mm/px · z∈[-889,-832]mm · 6 of 133 slices shown, 8 images]
[im 19/133  brain]
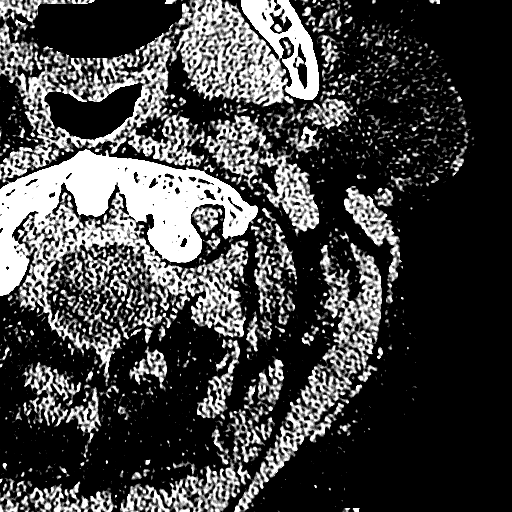
[im 19/133  bone]
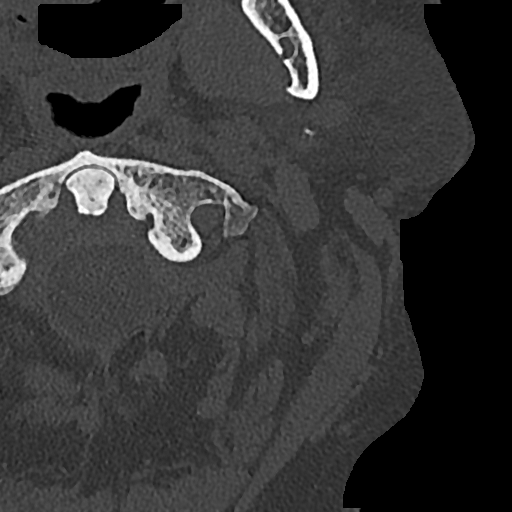
[im 38/133  bone]
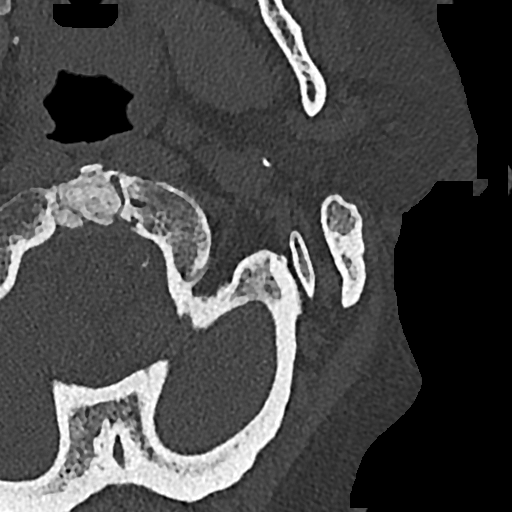
[im 57/133  bone]
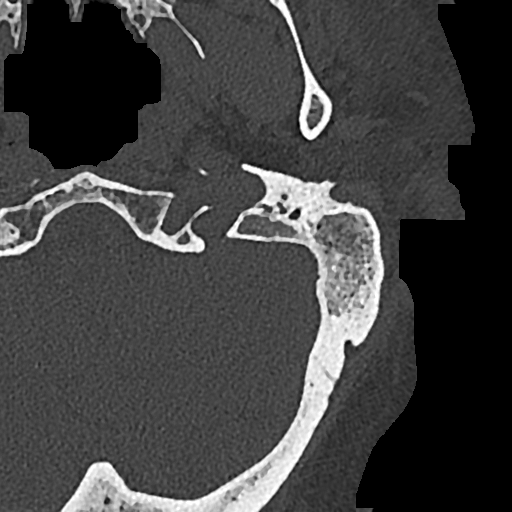
[im 76/133  bone]
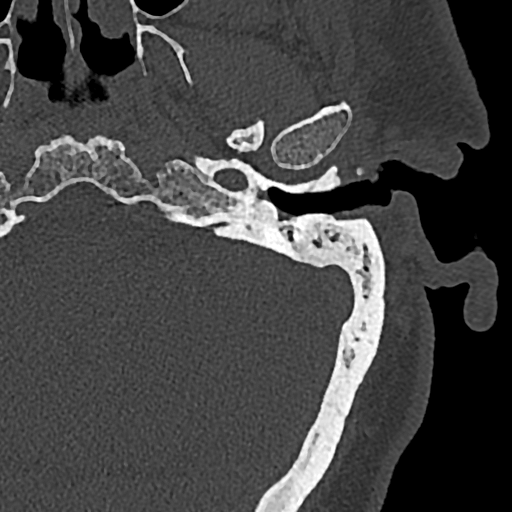
[im 95/133  brain]
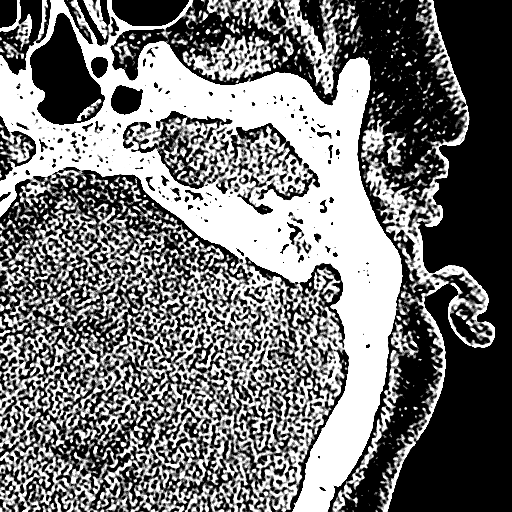
[im 95/133  bone]
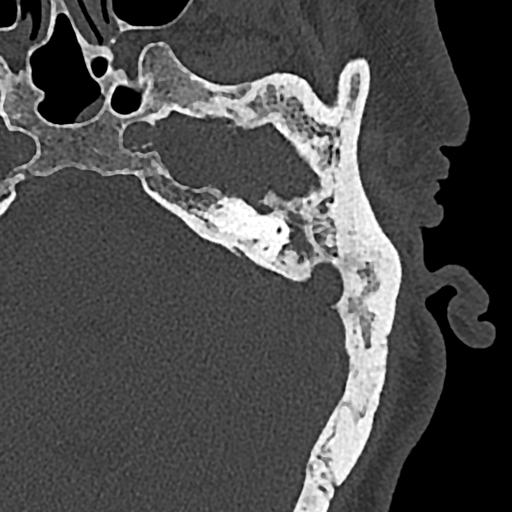
[im 114/133  bone]
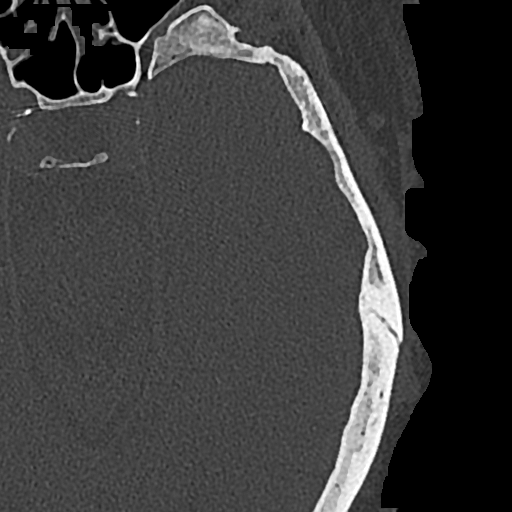

[Series 12: soft tissue post · axial · 0.42mm/px · z∈[-889,-855]mm · 4 of 133 slices shown]
[im 19/133  brain]
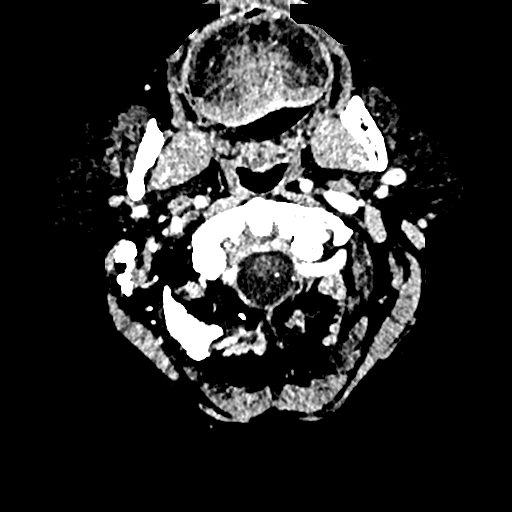
[im 38/133  brain]
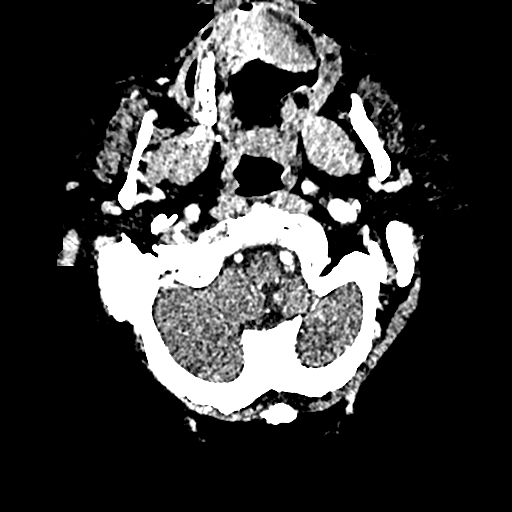
[im 57/133  brain]
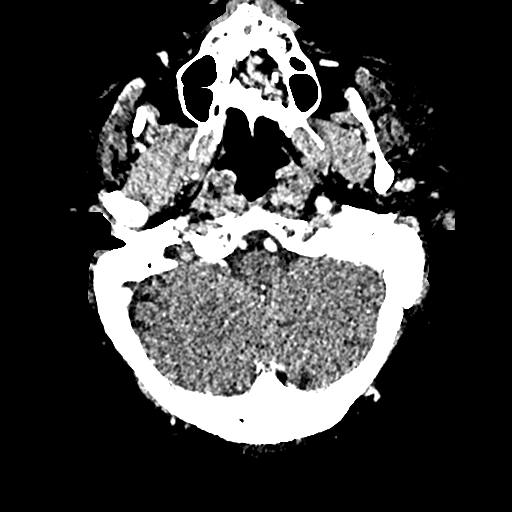
[im 76/133  brain]
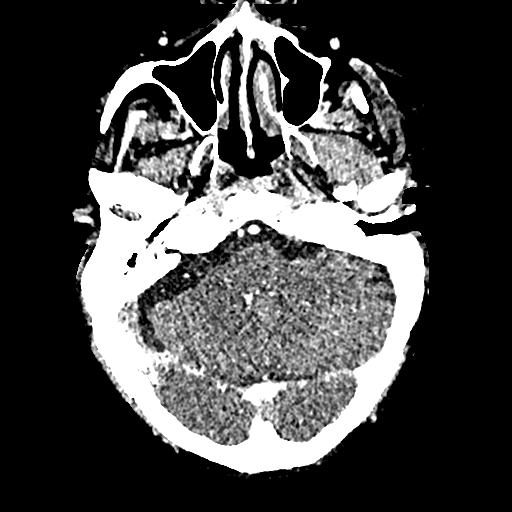

[Series 1001: rt coronal · coronal · 0.26mm/px · 1 of 150 slices shown]
[im 75/150  bone]
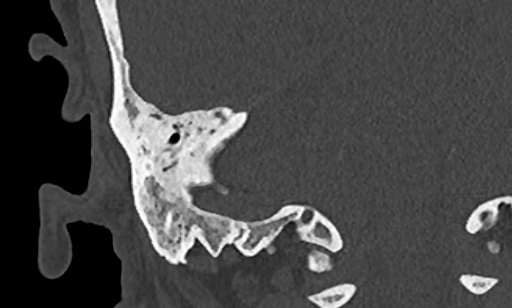

[16 of 40 positions shown; findings below may reference images not displayed]

FINDINGS: RIGHT TEMPORAL BONE:
EXTERNAL AUDITORY CANAL (EAC): Mild mucosal thickening of the superior aspect of the osseous segment of the external auditory canal.
TYMPANIC MEMBRANE: Not well visualized due to middle ear opacification.
MIDDLE EAR: Near-complete middle ear opacification.
OSSICULAR CHAINS: No discrete ossicular erosion. There is significant decreased space between the short limb of the incus and the lateral wall of the middle ear. Finding may represent a fibrous
versus osseous fixation.
INNER EAR STRUCTURES: The cochlea, vestibule and semicircular canals are normal in appearance without evidence of dehiscence. No otospongiosis.
FACIAL NERVE CANAL: Unremarkable.
INTERNAL AUDITORY CANAL (IAC): Unremarkable.
VESTIBULAR AQUEDUCT: Not enlarged.
MASTOID AIR CELLS: Hypoaerated and near completely opacified.
SOFT TISSUES: No significant soft tissue abnormalities.
LEFT TEMPORAL BONE:
EXTERNAL AUDITORY CANAL (EAC): Mild mucosal thickening of the superior and posterior aspects of the osseous external auditory canal.
TYMPANIC MEMBRANE: Not well visualized due to middle ear opacification.
MIDDLE EAR: Complete opacification of the middle ear.
OSSICULAR CHAINS: No discrete ossicular erosion. There is significant decreased space between the short limb of the incus and the lateral wall of the middle ear. Finding may represent a fibrous
versus osseous fixation.
INNER EAR STRUCTURES: The cochlea, vestibule and semicircular canals are normal in appearance without evidence of dehiscence. No otospongiosis.
FACIAL NERVE CANAL: Unremarkable.
INTERNAL AUDITORY CANAL (IAC): Unremarkable.
VESTIBULAR AQUEDUCT: Not enlarged.
MASTOID AIR CELLS: Hypoaerated and completely opacified.
SOFT TISSUES: No significant soft tissue abnormalities.
IMPRESSION: 1.  Mild mucosal thickening of the bilateral external auditory canals may be related to a degree of external otitis.
2.  Bilateral middle ear and mastoid effusions. No ossicular chain erosion. However, suspected fibrous versus osseous fixation of the short limb of the incus bilaterally.
